# Patient Record
Sex: Female | Born: 1970 | Race: White | Hispanic: No | Marital: Married | Smoking: Never smoker
Health system: Southern US, Community
[De-identification: ages and names within clinical notes are randomized; demographics above are authoritative.]

## PROBLEM LIST (undated history)

## (undated) DIAGNOSIS — K5190 Ulcerative colitis, unspecified, without complications: Secondary | ICD-10-CM

## (undated) DIAGNOSIS — I10 Essential (primary) hypertension: Secondary | ICD-10-CM

## (undated) HISTORY — DX: Ulcerative colitis, unspecified, without complications: K51.90

---

## 2006-02-06 ENCOUNTER — Ambulatory Visit: Payer: Self-pay

## 2006-04-26 ENCOUNTER — Inpatient Hospital Stay: Payer: Self-pay

## 2007-03-27 ENCOUNTER — Ambulatory Visit: Payer: Self-pay | Admitting: Family Medicine

## 2007-04-25 ENCOUNTER — Ambulatory Visit: Payer: Self-pay | Admitting: Gastroenterology

## 2008-06-26 ENCOUNTER — Observation Stay: Payer: Self-pay | Admitting: Obstetrics and Gynecology

## 2008-07-03 ENCOUNTER — Inpatient Hospital Stay: Payer: Self-pay | Admitting: Obstetrics and Gynecology

## 2014-05-28 ENCOUNTER — Ambulatory Visit
Admit: 2014-05-28 | Disposition: A | Discharge status: Home / Self Care | Payer: Self-pay | Attending: Gastroenterology | Admitting: Gastroenterology

## 2014-05-28 LAB — HM COLONOSCOPY

## 2014-06-03 ENCOUNTER — Other Ambulatory Visit
Admit: 2014-06-03 | Disposition: A | Discharge status: Home / Self Care | Payer: Self-pay | Attending: Urgent Care | Admitting: Urgent Care

## 2014-08-25 ENCOUNTER — Telehealth: Payer: Self-pay | Admitting: Family Medicine

## 2014-08-25 DIAGNOSIS — I10 Essential (primary) hypertension: Secondary | ICD-10-CM

## 2014-08-25 MED ORDER — AMLODIPINE BESYLATE 2.5 MG PO TABS
2.5000 mg | ORAL_TABLET | Freq: Every day | ORAL | Status: DC
Start: 2014-08-25 — End: 2016-04-14

## 2014-08-25 NOTE — Telephone Encounter (Signed)
Patient advised as below. Patient verbalizes understanding and is in agreement with treatment plan.  

## 2014-08-25 NOTE — Telephone Encounter (Signed)
Pt states she started a blood pressure medication 3 weeks ago and her blood pressure is still high.  On average it is running 170/120. Pt states she does feel tired and not able to sleep at night.  Pt is asking does she need to increase he Rx or change her Rx.  [place].  [phone]

## 2014-08-25 NOTE — Telephone Encounter (Signed)
Sent additional medication, add to current treatment and follow up ov in 2 weeks.  Thanks.

## 2015-01-28 ENCOUNTER — Other Ambulatory Visit: Payer: Self-pay | Admitting: Family Medicine

## 2015-01-28 DIAGNOSIS — Z1231 Encounter for screening mammogram for malignant neoplasm of breast: Secondary | ICD-10-CM

## 2015-02-01 ENCOUNTER — Ambulatory Visit
Admission: RE | Admit: 2015-02-01 | Discharge: 2015-02-01 | Disposition: A | Discharge status: Home / Self Care | Payer: BLUE CROSS/BLUE SHIELD | Source: Ambulatory Visit | Attending: Family Medicine | Admitting: Family Medicine

## 2015-02-01 DIAGNOSIS — Z1231 Encounter for screening mammogram for malignant neoplasm of breast: Secondary | ICD-10-CM | POA: Insufficient documentation

## 2015-04-02 ENCOUNTER — Encounter: Payer: Self-pay | Admitting: Physician Assistant

## 2015-04-02 ENCOUNTER — Ambulatory Visit (INDEPENDENT_AMBULATORY_CARE_PROVIDER_SITE_OTHER): Payer: BLUE CROSS/BLUE SHIELD | Admitting: Physician Assistant

## 2015-04-02 VITALS — BP 138/96 | HR 100 | Temp 97.6°F | Resp 14 | Ht 64.0 in | Wt 244.0 lb

## 2015-04-02 DIAGNOSIS — L509 Urticaria, unspecified: Secondary | ICD-10-CM | POA: Diagnosis not present

## 2015-04-02 MED ORDER — HYDROXYZINE HCL 10 MG PO TABS: 10.0000 mg | ORAL_TABLET | Freq: Every evening | ORAL | Status: DC | PRN

## 2015-04-02 NOTE — Progress Notes (Signed)
Patient ID: Jamie Fuentes, female   DOB: [birth year], 45 y.o.   MRN: [id number]       [name] Female    DOB: [birth year]   45 y.o.   MRN: [id number] Visit Date: 04/02/2015  Today's Provider: Mar Daring, PA-C   Chief Complaint  Patient presents with  . Rash   Subjective:    HPI  Patient developed rash 2 days ago Feb 1st. Rash is located on arms, legs, stomach area and back. Some areas are worse than others patient states. Rash looks more like hives-red, itchy. She took Benadryl the first day it appeared and hives went away and she was ok until the following day she started to break out again so she took Benadryl again and it resolved the break out again. She is broken out again this morning. She does not recall been around something that could have irritated her skin like this, she has not changed lotions, bath soap or detergent. No insect bites, no tick exposure.    Allergies  Allergen Reactions  . Lac Bovis   . Sulfa Antibiotics Nausea Only, Rash and Nausea And Vomiting   Previous Medications   AMLODIPINE (NORVASC) 2.5 MG TABLET    Take 1 tablet (2.5 mg total) by mouth daily.   HYDROCHLOROTHIAZIDE (HYDRODIURIL) 12.5 MG TABLET    Take by mouth.   LISINOPRIL (PRINIVIL,ZESTRIL) 10 MG TABLET    Take by mouth.   MESALAMINE (LIALDA) 1.2 G EC TABLET    Take by mouth.   MULTIPLE VITAMIN TABLET    Take by mouth.    Review of Systems  Constitutional: Negative for fever, chills and fatigue.  HENT: Positive for facial swelling (lips a little this morning it felt like ). Negative for congestion, postnasal drip, rhinorrhea, sinus pressure, sneezing, sore throat, tinnitus, trouble swallowing and voice change.   Eyes: Negative.   Respiratory: Negative for cough, chest tightness, shortness of breath and wheezing.   Cardiovascular: Negative for chest pain and palpitations.  Gastrointestinal: Negative for nausea, vomiting, abdominal pain, diarrhea and constipation.    Musculoskeletal: Negative for myalgias, back pain, joint swelling, arthralgias, gait problem, neck pain and neck stiffness.  Skin: Positive for rash.  Allergic/Immunologic: Negative for environmental allergies, food allergies and immunocompromised state.  Neurological: Negative for dizziness, weakness, numbness and headaches.  Psychiatric/Behavioral: Negative for sleep disturbance, dysphoric mood, decreased concentration and agitation. The patient is not nervous/anxious.     Social History  Substance Use Topics  . Smoking status: Never Smoker   . Smokeless tobacco: Never Used  . Alcohol Use: Yes     Comment: rare   Objective:   BP 138/96 mmHg  Pulse 100  Temp(Src) 97.6 F (36.4 C)  Resp 14  Ht '5\' 4"'  (1.626 m)  Wt 244 lb (110.678 kg)  BMI 41.86 kg/m2  LMP 03/31/2015  Physical Exam  Constitutional: She appears well-developed and well-nourished. No distress.  Neck: Normal range of motion. Neck supple. No JVD present. No tracheal deviation present. No thyromegaly present.  Cardiovascular: Normal rate, regular rhythm and normal heart sounds.  Exam reveals no gallop and no friction rub.   No murmur heard. Pulmonary/Chest: Effort normal and breath sounds normal. No respiratory distress. She has no wheezes. She has no rales.  Lymphadenopathy:    She has no cervical adenopathy.  Skin: Skin is warm and dry. Rash noted. Rash is urticarial (noted on upper extremities bilaterally, hands, lower extremities bilaterally, abdomen and back). She is not diaphoretic.  Vitals  reviewed.       Assessment & Plan:     1. Urticaria Continue benadryl during the day as needed for urticaria. May take claritin, zyrtec, or allegra if benadryl causes sleepiness. Hydroxyzine is for nighttime use only. Make sure that no detergents, body wash, lotions, etc have been changed. Make sure there is no food correlation.  Call the office if you develop a fever, joint pains, headache, or if symptoms persist for  greater than 6 weeks.  If hives continue for greater than 6 weeks will obtain blood work including CBC, ESR, CRP, ANA and RF.  May also consider referral to allergist if wanted or warranted.  - hydrOXYzine (ATARAX/VISTARIL) 10 MG tablet; Take 1 tablet (10 mg total) by mouth at bedtime as needed.  Dispense: 30 tablet; Refill: 0       Mar Daring, PA-C  [facility]

## 2015-04-02 NOTE — Patient Instructions (Signed)
Hives Hives are itchy, red, swollen areas of the skin. They can vary in size and location on your body. Hives can come and go for hours or several days (acute hives) or for several weeks (chronic hives). Hives do not spread from person to person (noncontagious). They may get worse with scratching, exercise, and emotional stress. CAUSES   Allergic reaction to food, additives, or drugs.  Infections, including the common cold.  Illness, such as vasculitis, lupus, or thyroid disease.  Exposure to sunlight, heat, or cold.  Exercise.  Stress.  Contact with chemicals. SYMPTOMS   Red or white swollen patches on the skin. The patches may change size, shape, and location quickly and repeatedly.  Itching.  Swelling of the hands, feet, and face. This may occur if hives develop deeper in the skin. DIAGNOSIS  Your caregiver can usually tell what is wrong by performing a physical exam. Skin or blood tests may also be done to determine the cause of your hives. In some cases, the cause cannot be determined. TREATMENT  Mild cases usually get better with medicines such as antihistamines. Severe cases may require an emergency epinephrine injection. If the cause of your hives is known, treatment includes avoiding that trigger.  HOME CARE INSTRUCTIONS   Avoid causes that trigger your hives.  Take antihistamines as directed by your caregiver to reduce the severity of your hives. Non-sedating or low-sedating antihistamines are usually recommended. Do not drive while taking an antihistamine.  Take any other medicines prescribed for itching as directed by your caregiver.  Wear loose-fitting clothing.  Keep all follow-up appointments as directed by your caregiver. SEEK MEDICAL CARE IF:   You have persistent or severe itching that is not relieved with medicine.  You have painful or swollen joints. SEEK IMMEDIATE MEDICAL CARE IF:   You have a fever.  Your tongue or lips are swollen.  You have  trouble breathing or swallowing.  You feel tightness in the throat or chest.  You have abdominal pain. These problems may be the first sign of a life-threatening allergic reaction. Call your local emergency services (911 in [place] MAKE SURE YOU:   Understand these instructions.  Will watch your condition.  Will get help right away if you are not doing well or get worse.   This information is not intended to replace advice given to you by your health care provider. Make sure you discuss any questions you have with your health care provider.   Document Released: 02/13/2005 Document Revised: 02/18/2013 Document Reviewed: 05/09/2011 Elsevier Interactive Patient Education 2016 [employer]  

## 2015-09-03 ENCOUNTER — Encounter: Payer: BLUE CROSS/BLUE SHIELD | Admitting: Physician Assistant

## 2015-10-21 ENCOUNTER — Encounter: Payer: BLUE CROSS/BLUE SHIELD | Admitting: Physician Assistant

## 2015-11-11 ENCOUNTER — Ambulatory Visit (INDEPENDENT_AMBULATORY_CARE_PROVIDER_SITE_OTHER): Payer: BLUE CROSS/BLUE SHIELD | Admitting: Physician Assistant

## 2015-11-11 ENCOUNTER — Encounter: Payer: Self-pay | Admitting: Physician Assistant

## 2015-11-11 VITALS — BP 138/98 | HR 80 | Temp 98.2°F | Resp 16 | Ht 64.25 in | Wt 247.0 lb

## 2015-11-11 DIAGNOSIS — Z1239 Encounter for other screening for malignant neoplasm of breast: Secondary | ICD-10-CM | POA: Diagnosis not present

## 2015-11-11 DIAGNOSIS — Z23 Encounter for immunization: Secondary | ICD-10-CM | POA: Diagnosis not present

## 2015-11-11 DIAGNOSIS — Z0000 Encounter for general adult medical examination without abnormal findings: Secondary | ICD-10-CM | POA: Diagnosis not present

## 2015-11-11 DIAGNOSIS — H6123 Impacted cerumen, bilateral: Secondary | ICD-10-CM | POA: Diagnosis not present

## 2015-11-11 DIAGNOSIS — IMO0002 Reserved for concepts with insufficient information to code with codable children: Secondary | ICD-10-CM

## 2015-11-11 DIAGNOSIS — I10 Essential (primary) hypertension: Secondary | ICD-10-CM | POA: Diagnosis not present

## 2015-11-11 DIAGNOSIS — R7303 Prediabetes: Secondary | ICD-10-CM | POA: Diagnosis not present

## 2015-11-11 DIAGNOSIS — E559 Vitamin D deficiency, unspecified: Secondary | ICD-10-CM | POA: Insufficient documentation

## 2015-11-11 DIAGNOSIS — E668 Other obesity: Secondary | ICD-10-CM

## 2015-11-11 NOTE — Progress Notes (Signed)
[name], Female    DOB: [birth year], 45 y.o.   MRN: [id number] Visit Date: 11/11/2015  Today's Provider: Mar Daring, PA-C   Chief Complaint  Patient presents with  . Annual Exam   Subjective:    Annual physical exam Jamie Fuentes is a 45 y.o. female who presents today for health maintenance and complete physical. She feels well. She reports exercising some. She reports she is sleeping well.  Patient would like to get flu shot done today.  Last: Eye exam was around June or July 2017.  Colonoscopy 05/28/14 has colitis.  Pap smear-unk  Mammogram 01/2015-normal.  Tdap patient states about 7 years ago at [facility] find  those records.  Review of Systems  Constitutional: Negative.   Eyes: Negative.   Respiratory: Positive for shortness of breath (usually when exercising or exertion like that).   Cardiovascular: Negative.   Gastrointestinal: Negative.   Endocrine: Negative.   Genitourinary: Negative.   Musculoskeletal: Negative.   Skin: Negative.   Allergic/Immunologic: Negative.   Neurological: Positive for headaches.  Hematological: Negative.   Psychiatric/Behavioral: Negative.     Social History      She  reports that she has never smoked. She has never used smokeless tobacco. She reports that she drinks alcohol. She reports that she does not use drugs.       Social History   Social History  . Marital status: Married    Spouse name: N/A  . Number of children: N/A  . Years of education: N/A   Social History Main Topics  . Smoking status: Never Smoker  . Smokeless tobacco: Never Used  . Alcohol use Yes     Comment: rare  . Drug use: No  . Sexual activity: Not Asked   Other Topics Concern  . None   Social History Narrative  . None    History reviewed. No pertinent past medical history.   Patient Active Problem List   Diagnosis Date Noted  . Avitaminosis D 11/11/2015  . 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 Adult BMI  30+ 11/11/2015  . Hypertension 08/25/2014    Past Surgical History:  Procedure Laterality Date  . CESAREAN SECTION      Family History        Family Status  Relation Status  . Mother Alive  . Father Alive  . Sister Alive  . Brother Alive  . Brother Alive        Her family history includes Diabetes in her father; Hypertension in her brother and father; Thyroid cancer in her sister.    Allergies  Allergen Reactions  . Lac Bovis   . Sulfa Antibiotics Nausea Only, Rash and Nausea And Vomiting    Current Meds  Medication Sig  . amLODipine (NORVASC) 2.5 MG tablet Take 1 tablet (2.5 mg total) by mouth daily.  . hydrochlorothiazide (HYDRODIURIL) 12.5 MG tablet Take by mouth.  . hydrOXYzine (ATARAX/VISTARIL) 10 MG tablet Take 1 tablet (10 mg total) by mouth at bedtime as needed.  Marland Kitchen lisinopril (PRINIVIL,ZESTRIL) 10 MG tablet Take by mouth.  . mesalamine (LIALDA) 1.2 g EC tablet Take by mouth.  . Multiple Vitamin tablet Take by mouth.    Patient Care Team: Mar Daring, PA-C as PCP - General (Family Medicine)     Objective:   Vitals: BP (!) 138/98   Pulse 80   Temp 98.2 F (36.8 C)   Resp 16   Ht 5' 4.25" (1.632 m)   Wt  247 lb (112 kg)   BMI 42.07 kg/m    Physical Exam  Constitutional: She is oriented to person, place, and time. She appears well-developed and well-nourished. No distress.  HENT:  Head: Normocephalic and atraumatic.  Right Ear: Hearing, tympanic membrane, external ear and ear canal normal.  Left Ear: Hearing, tympanic membrane, external ear and ear canal normal.  Nose: Nose normal.  Mouth/Throat: Uvula is midline, oropharynx is clear and moist and mucous membranes are normal. No oropharyngeal exudate.  Cerumen impaction noted bilaterally. Air lavage was performed bilaterally. This was successful. Tympanic membranes were visualized following lavage and were normal.  Eyes: Conjunctivae and EOM are normal. Pupils are equal, round, and reactive to  light. Right eye exhibits no discharge. Left eye exhibits no discharge. No scleral icterus.  Neck: Normal range of motion. Neck supple. No JVD present. Carotid bruit is not present. No tracheal deviation present. No thyromegaly present.  Cardiovascular: Normal rate, regular rhythm, normal heart sounds and intact distal pulses.  Exam reveals no gallop and no friction rub.   No murmur heard. Pulmonary/Chest: Effort normal and breath sounds normal. No respiratory distress. She has no wheezes. She has no rales. She exhibits no tenderness. Right breast exhibits no inverted nipple, no mass, no nipple discharge, no skin change and no tenderness. Left breast exhibits no inverted nipple, no mass, no nipple discharge, no skin change and no tenderness. Breasts are symmetrical.  Abdominal: Soft. Bowel sounds are normal. She exhibits no distension and no mass. There is no tenderness. There is no rebound and no guarding.  Genitourinary: No breast swelling, tenderness, discharge or bleeding. Pelvic exam was performed with patient supine.  Genitourinary Comments: Patient refused due to menstrual cycle. She reports she will have this done next year.  Musculoskeletal: Normal range of motion. She exhibits no edema or tenderness.  Lymphadenopathy:    She has no cervical adenopathy.  Neurological: She is alert and oriented to person, place, and time. She has normal reflexes. No cranial nerve deficit. Coordination normal.  Skin: Skin is warm and dry. No rash noted. She is not diaphoretic.  Psychiatric: She has a normal mood and affect. Her behavior is normal. Judgment and thought content normal.  Vitals reviewed.    Depression Screen No flowsheet data found.    Assessment & Plan:  1. Annual physical exam Normal physical exam today. Will check labs as below and f/u pending lab results. If labs are stable and WNL she will not need to have these rechecked for one year at her next annual physical exam. She is to call  the office in the meantime if she has any acute issue, questions or concerns. - CBC with Differential - TSH  2. Breast cancer screening Breast exam today was normal. There is no family history of breast cancer. She does perform regular self breast exams. Mammogram was ordered as below. Information for [facility] clinic was given to patient so she may schedule her mammogram at her convenience. - Mammogram Digital Screening; Future  3. Cerumen impaction, bilateral Lavage successful. - Ear Lavage  4. Need for influenza vaccination Flu vaccine given today without complication. Patient sat upright for 15 minutes to check for adverse reaction before being released. - Flu Vaccine QUAD 36+ mos PF IM (Fluarix & Fluzone Quad PF)  5. Essential hypertension Stable. Continue current medical treatment plan. Will check labs as below and f/u pending results. 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  Will check labs as below and f/u pending results. - Hemoglobin A1c  7. Adult BMI 30+ Will check labs as below and f/u pending results. - Lipid panel      Mar Daring, PA-C  [facility]

## 2015-11-11 NOTE — Patient Instructions (Signed)
Health Maintenance, Female Adopting a healthy lifestyle and getting preventive care can go a long way to promote health and wellness. Talk with your health care provider about what schedule of regular examinations is right for you. This is a good chance for you to check in with your provider about disease prevention and staying healthy. In between checkups, there are plenty of things you can do on your own. Experts have done a lot of research about which lifestyle changes and preventive measures are most likely to keep you healthy. Ask your health care provider for more information. WEIGHT AND DIET  Eat a healthy diet  Be sure to include plenty of vegetables, fruits, low-fat dairy products, and lean protein.  Do not eat a lot of foods high in solid fats, added sugars, or salt.  Get regular exercise. This is one of the most important things you can do for your health.  Most adults should exercise for at least 150 minutes each week. The exercise should increase your heart rate and make you sweat (moderate-intensity exercise).  Most adults should also do strengthening exercises at least twice a week. This is in addition to the moderate-intensity exercise.  Maintain a healthy weight  Body mass index (BMI) is a measurement that can be used to identify possible weight problems. It estimates body fat based on height and weight. Your health care provider can help determine your BMI and help you achieve or maintain a healthy weight.  For females 43 years of age and older:   A BMI below 18.5 is considered underweight.  A BMI of 18.5 to 24.9 is normal.  A BMI of 25 to 29.9 is considered overweight.  A BMI of 30 and above is considered obese.  Watch levels of cholesterol and blood lipids  You should start having your blood tested for lipids and cholesterol at 43 years of age, then have this test every 5 years.  You may need to have your cholesterol levels checked more often if:  Your lipid  or cholesterol levels are high.  You are older than 43 years of age.  You are at high risk for heart disease.  CANCER SCREENING   Lung Cancer  Lung cancer screening is recommended for adults 43-66 years old who are at high risk for lung cancer because of a history of smoking.  A yearly low-dose CT scan of the lungs is recommended for people who:  Currently smoke.  Have quit within the past 15 years.  Have at least a 30-pack-year history of smoking. A pack year is smoking an average of one pack of cigarettes a day for 1 year.  Yearly screening should continue until it has been 15 years since you quit.  Yearly screening should stop if you develop a health problem that would prevent you from having lung cancer treatment.  Breast Cancer  Practice breast self-awareness. This means understanding how your breasts normally appear and feel.  It also means doing regular breast self-exams. Let your health care provider know about any changes, no matter how small.  If you are in your 20s or 30s, you should have a clinical breast exam (CBE) by a health care provider every 1-3 years as part of a regular health exam.  If you are 25 or older, have a CBE every year. Also consider having a breast X-ray (mammogram) every year.  If you have a family history of breast cancer, talk to your health care provider about genetic screening.  If you  are at high risk for breast cancer, talk to your health care provider about having an MRI and a mammogram every year.  Breast cancer gene (BRCA) assessment is recommended for women who have family members with BRCA-related cancers. BRCA-related cancers include:  Breast.  Ovarian.  Tubal.  Peritoneal cancers.  Results of the assessment will determine the need for genetic counseling and BRCA1 and BRCA2 testing. Cervical Cancer Your health care provider may recommend that you be screened regularly for cancer of the pelvic organs (ovaries, uterus, and  vagina). This screening involves a pelvic examination, including checking for microscopic changes to the surface of your cervix (Pap test). You may be encouraged to have this screening done every 3 years, beginning at age 43.  For women ages 30-65, health care providers may recommend pelvic exams and Pap testing every 3 years, or they may recommend the Pap and pelvic exam, combined with testing for human papilloma virus (HPV), every 5 years. Some types of HPV increase your risk of cervical cancer. Testing for HPV may also be done on women of any age with unclear Pap test results.  Other health care providers may not recommend any screening for nonpregnant women who are considered low risk for pelvic cancer and who do not have symptoms. Ask your health care provider if a screening pelvic exam is right for you.  If you have had past treatment for cervical cancer or a condition that could lead to cancer, you need Pap tests and screening for cancer for at least 20 years after your treatment. If Pap tests have been discontinued, your risk factors (such as having a new sexual partner) need to be reassessed to determine if screening should resume. Some women have medical problems that increase the chance of getting cervical cancer. In these cases, your health care provider may recommend more frequent screening and Pap tests. Colorectal Cancer  This type of cancer can be detected and often prevented.  Routine colorectal cancer screening usually begins at 43 years of age and continues through 43 years of age.  Your health care provider may recommend screening at an earlier age if you have risk factors for colon cancer.  Your health care provider may also recommend using home test kits to check for hidden blood in the stool.  A small camera at the end of a tube can be used to examine your colon directly (sigmoidoscopy or colonoscopy). This is done to check for the earliest forms of colorectal  cancer.  Routine screening usually begins at age 50.  Direct examination of the colon should be repeated every 5-10 years through 43 years of age. However, you may need to be screened more often if early forms of precancerous polyps or small growths are found. Skin Cancer  Check your skin from head to toe regularly.  Tell your health care provider about any new moles or changes in moles, especially if there is a change in a mole's shape or color.  Also tell your health care provider if you have a mole that is larger than the size of a pencil eraser.  Always use sunscreen. Apply sunscreen liberally and repeatedly throughout the day.  Protect yourself by wearing long sleeves, pants, a wide-brimmed hat, and sunglasses whenever you are outside. HEART DISEASE, DIABETES, AND HIGH BLOOD PRESSURE   High blood pressure causes heart disease and increases the risk of stroke. High blood pressure is more likely to develop in:  People who have blood pressure in the high end   of the normal range (130-139/85-89 mm Hg).  People who are overweight or obese.  People who are African American.  If you are 38-23 years of age, have your blood pressure checked every 3-5 years. If you are 61 years of age or older, have your blood pressure checked every year. You should have your blood pressure measured twice--once when you are at a hospital or clinic, and once when you are not at a hospital or clinic. Record the average of the two measurements. To check your blood pressure when you are not at a hospital or clinic, you can use:  An automated blood pressure machine at a pharmacy.  A home blood pressure monitor.  If you are between 45 years and 39 years old, ask your health care provider if you should take aspirin to prevent strokes.  Have regular diabetes screenings. This involves taking a blood sample to check your fasting blood sugar level.  If you are at a normal weight and have a low risk for diabetes,  have this test once every three years after 43 years of age.  If you are overweight and have a high risk for diabetes, consider being tested at a younger age or more often. PREVENTING INFECTION  Hepatitis B  If you have a higher risk for hepatitis B, you should be screened for this virus. You are considered at high risk for hepatitis B if:  You were born in a country where hepatitis B is common. Ask your health care provider which countries are considered high risk.  Your parents were born in a high-risk country, and you have not been immunized against hepatitis B (hepatitis B vaccine).  You have HIV or AIDS.  You use needles to inject street drugs.  You live with someone who has hepatitis B.  You have had sex with someone who has hepatitis B.  You get hemodialysis treatment.  You take certain medicines for conditions, including cancer, organ transplantation, and autoimmune conditions. Hepatitis C  Blood testing is recommended for:  Everyone born from [birth year] through 1963.  Anyone with known risk factors for hepatitis C. Sexually transmitted infections (STIs)  You should be screened for sexually transmitted infections (STIs) including gonorrhea and chlamydia if:  You are sexually active and are younger than 43 years of age.  You are older than 43 years of age and your health care provider tells you that you are at risk for this type of infection.  Your sexual activity has changed since you were last screened and you are at an increased risk for chlamydia or gonorrhea. Ask your health care provider if you are at risk.  If you do not have HIV, but are at risk, it may be recommended that you take a prescription medicine daily to prevent HIV infection. This is called pre-exposure prophylaxis (PrEP). You are considered at risk if:  You are sexually active and do not regularly use condoms or know the HIV status of your partner(s).  You take drugs by injection.  You are sexually  active with a partner who has HIV. Talk with your health care provider about whether you are at high risk of being infected with HIV. If you choose to begin PrEP, you should first be tested for HIV. You should then be tested every 3 months for as long as you are taking PrEP.  PREGNANCY   If you are premenopausal and you may become pregnant, ask your health care provider about preconception counseling.  If you may  become pregnant, take 400 to 800 micrograms (mcg) of folic acid every day.  If you want to prevent pregnancy, talk to your health care provider about birth control (contraception). OSTEOPOROSIS AND MENOPAUSE   Osteoporosis is a disease in which the bones lose minerals and strength with aging. This can result in serious bone fractures. Your risk for osteoporosis can be identified using a bone density scan.  If you are 61 years of age or older, or if you are at risk for osteoporosis and fractures, ask your health care provider if you should be screened.  Ask your health care provider whether you should take a calcium or vitamin D supplement to lower your risk for osteoporosis.  Menopause may have certain physical symptoms and risks.  Hormone replacement therapy may reduce some of these symptoms and risks. Talk to your health care provider about whether hormone replacement therapy is right for you.  HOME CARE INSTRUCTIONS   Schedule regular health, dental, and eye exams.  Stay current with your immunizations.   Do not use any tobacco products including cigarettes, chewing tobacco, or electronic cigarettes.  If you are pregnant, do not drink alcohol.  If you are breastfeeding, limit how much and how often you drink alcohol.  Limit alcohol intake to no more than 1 drink per day for nonpregnant women. One drink equals 12 ounces of beer, 5 ounces of wine, or 1 ounces of hard liquor.  Do not use street drugs.  Do not share needles.  Ask your health care provider for help if  you need support or information about quitting drugs.  Tell your health care provider if you often feel depressed.  Tell your health care provider if you have ever been abused or do not feel safe at home.   This information is not intended to replace advice given to you by your health care provider. Make sure you discuss any questions you have with your health care provider.   Document Released: 08/29/2010 Document Revised: 03/06/2014 Document Reviewed: 01/15/2013 Elsevier Interactive Patient Education [employer].

## 2016-02-13 LAB — HM PAP SMEAR: HM Pap smear: NEGATIVE

## 2016-04-13 ENCOUNTER — Telehealth: Payer: Self-pay

## 2016-04-13 DIAGNOSIS — H35033 Hypertensive retinopathy, bilateral: Secondary | ICD-10-CM | POA: Diagnosis not present

## 2016-04-13 NOTE — Telephone Encounter (Signed)
Contacted patient back to get an update if she went to pharmacy to get blood pressure re-checked, patient states that blood pressure machine was broke at pharmacy. KW

## 2016-04-13 NOTE — Telephone Encounter (Signed)
Patient called office requesting a refill on her blood pressure medication, patient stated that she has been of her medication for several months. Patient told CMA her blood pressure at dentist office this afternoon was 211/117, I triaged patient and she denied symptoms of shortness of breath, light headiness/dizziness, paresthesias/tingling, visual disturbance, arm or chest pain. I advised patient that she should see immediate medical attention if her blood pressure reading was that high to make sure it was an accurate reading. Patient had stated that her blood pressure has been running >190/100. I spoke with physician who agreed that patient should get blood pressure rechecked at pharmacy and if any of the above symptoms occurred that she immediatly go to the ER. Patient agreed that she will go to pharmacy to get blood pressure checked, I asked patient to give office a call back as soon as she checks it at pharmacy before 5pm. Patient has set up an appointment with Victorino DikeJennifer to follow up tomorrow afternoon at 1:30PM. Renette ButtersKW

## 2016-04-13 NOTE — Telephone Encounter (Signed)
Noted. Nicholos JohnsKathleen came and spoke with me about the patient. Advised to go to ER or Urgent care to have BP rechecked. Patient declined. Advised to recheck at fire dept or pharmacy. [facility]. Patient was given strict precautions to go to ER if she developed chest pains, SOB, weakness on one side or the other, visual changes, or dizziness.

## 2016-04-14 ENCOUNTER — Encounter: Payer: Self-pay | Admitting: Physician Assistant

## 2016-04-14 ENCOUNTER — Ambulatory Visit (INDEPENDENT_AMBULATORY_CARE_PROVIDER_SITE_OTHER): Payer: BLUE CROSS/BLUE SHIELD | Admitting: Physician Assistant

## 2016-04-14 VITALS — BP 170/120 | HR 64 | Temp 97.7°F | Resp 16 | Wt 255.6 lb

## 2016-04-14 DIAGNOSIS — I10 Essential (primary) hypertension: Secondary | ICD-10-CM

## 2016-04-14 DIAGNOSIS — G479 Sleep disorder, unspecified: Secondary | ICD-10-CM

## 2016-04-14 MED ORDER — AMLODIPINE BESYLATE 10 MG PO TABS: 10.0000 mg | ORAL_TABLET | Freq: Every day | ORAL | 0 refills | Status: DC

## 2016-04-14 MED ORDER — LISINOPRIL-HYDROCHLOROTHIAZIDE 10-12.5 MG PO TABS: 1.0000 | ORAL_TABLET | Freq: Every day | ORAL | 0 refills | Status: DC

## 2016-04-14 NOTE — Patient Instructions (Signed)
Hypertension Hypertension, commonly called high blood pressure, is when the force of blood pumping through your arteries is too strong. Your arteries are the blood vessels that carry blood from your heart throughout your body. A blood pressure reading consists of a higher number over a lower number, such as 110/72. The higher number (systolic) is the pressure inside your arteries when your heart pumps. The lower number (diastolic) is the pressure inside your arteries when your heart relaxes. Ideally you want your blood pressure below 120/80. Hypertension forces your heart to work harder to pump blood. Your arteries may become narrow or stiff. Having untreated or uncontrolled hypertension can cause heart attack, stroke, kidney disease, and other problems. What increases the risk? Some risk factors for high blood pressure are controllable. Others are not. Risk factors you cannot control include:  Race. You may be at higher risk if you are African American.  Age. Risk increases with age.  Gender. Men are at higher risk than women before age 45 years. After age 65, women are at higher risk than men. Risk factors you can control include:  Not getting enough exercise or physical activity.  Being overweight.  Getting too much fat, sugar, calories, or salt in your diet.  Drinking too much alcohol. What are the signs or symptoms? Hypertension does not usually cause signs or symptoms. Extremely high blood pressure (hypertensive crisis) may cause headache, anxiety, shortness of breath, and nosebleed. How is this diagnosed? To check if you have hypertension, your health care provider will measure your blood pressure while you are seated, with your arm held at the level of your heart. It should be measured at least twice using the same arm. Certain conditions can cause a difference in blood pressure between your right and left arms. A blood pressure reading that is higher than normal on one occasion does  not mean that you need treatment. If it is not clear whether you have high blood pressure, you may be asked to return on a different day to have your blood pressure checked again. Or, you may be asked to monitor your blood pressure at home for 1 or more weeks. How is this treated? Treating high blood pressure includes making lifestyle changes and possibly taking medicine. Living a healthy lifestyle can help lower high blood pressure. You may need to change some of your habits. Lifestyle changes may include:  Following the DASH diet. This diet is high in fruits, vegetables, and whole grains. It is low in salt, red meat, and added sugars.  Keep your sodium intake below 2,300 mg per day.  Getting at least 30-45 minutes of aerobic exercise at least 4 times per week.  Losing weight if necessary.  Not smoking.  Limiting alcoholic beverages.  Learning ways to reduce stress. Your health care provider may prescribe medicine if lifestyle changes are not enough to get your blood pressure under control, and if one of the following is true:  You are 18-59 years of age and your systolic blood pressure is above 140.  You are 60 years of age or older, and your systolic blood pressure is above 150.  Your diastolic blood pressure is above 90.  You have diabetes, and your systolic blood pressure is over 140 or your diastolic blood pressure is over 90.  You have kidney disease and your blood pressure is above 140/90.  You have heart disease and your blood pressure is above 140/90. Your personal target blood pressure may vary depending on your medical   conditions, your age, and other factors. Follow these instructions at home:  Have your blood pressure rechecked as directed by your health care provider.  Take medicines only as directed by your health care provider. Follow the directions carefully. Blood pressure medicines must be taken as prescribed. The medicine does not work as well when you skip  doses. Skipping doses also puts you at risk for problems.  Do not smoke.  Monitor your blood pressure at home as directed by your health care provider. Contact a health care provider if:  You think you are having a reaction to medicines taken.  You have recurrent headaches or feel dizzy.  You have swelling in your ankles.  You have trouble with your vision. Get help right away if:  You develop a severe headache or confusion.  You have unusual weakness, numbness, or feel faint.  You have severe chest or abdominal pain.  You vomit repeatedly.  You have trouble breathing. This information is not intended to replace advice given to you by your health care provider. Make sure you discuss any questions you have with your health care provider. Document Released: 02/13/2005 Document Revised: 07/22/2015 Document Reviewed: 12/06/2012 Elsevier Interactive Patient Education  2017 Elsevier Inc.  

## 2016-04-14 NOTE — Progress Notes (Signed)
[name] Female    DOB: 1970/05/26   46 y.o.   MRN: [id number] Visit Date: 04/14/2016  Today's Provider: Margaretann Loveless, PA-C   Chief Complaint  Patient presents with  . Hypertension   Subjective:    HPI Patient is here today with c/o elevated blood pressure. She reports that she went to her dentist yesterday and BP reading was 217/117. She reports not taking any of her medicines because they are all expired.Cardiac symptoms none. Patient denies chest pain, chest pressure/discomfort, claudication, exertional chest pressure/discomfort, fatigue, irregular heart beat, lower extremity edema, palpitations and syncope. The only symptoms that she has is sleep disturbances. She does report she has not been sleeping well for the last 2 weeks since her uncle passed away.     Allergies  Allergen Reactions  . Lac Bovis   . Sulfa Antibiotics Nausea Only, Rash and Nausea And Vomiting     Current Outpatient Prescriptions:  .  amLODipine (NORVASC) 2.5 MG tablet, Take 1 tablet (2.5 mg total) by mouth daily. (Patient not taking: Reported on 04/14/2016), Disp: 90 tablet, Rfl: 3 .  hydrochlorothiazide (HYDRODIURIL) 12.5 MG tablet, Take by mouth., Disp: , Rfl:  .  hydrOXYzine (ATARAX/VISTARIL) 10 MG tablet, Take 1 tablet (10 mg total) by mouth at bedtime as needed. (Patient not taking: Reported on 04/14/2016), Disp: 30 tablet, Rfl: 0 .  lisinopril (PRINIVIL,ZESTRIL) 10 MG tablet, Take by mouth., Disp: , Rfl:  .  mesalamine (LIALDA) 1.2 g EC tablet, Take by mouth., Disp: , Rfl:  .  Multiple Vitamin tablet, Take by mouth., Disp: , Rfl:   Review of Systems  Constitutional: Negative.   Respiratory: Negative.   Cardiovascular: Negative for chest pain, palpitations and leg swelling.  Gastrointestinal: Negative.   Neurological: Negative for dizziness, light-headedness and headaches.    Social History  Substance Use Topics  . Smoking status: Never Smoker  . Smokeless  tobacco: Never Used  . Alcohol use Yes     Comment: rare   Objective:   BP (!) 170/120 (BP Location: Right Arm, Patient Position: Sitting, Cuff Size: Large)   Pulse 64   Temp 97.7 F (36.5 C) (Oral)   Resp 16   Wt 255 lb 9.6 oz (115.9 kg)   BMI 43.53 kg/m   Physical Exam  Constitutional: She appears well-developed and well-nourished. No distress.  Neck: Normal range of motion. Neck supple. No JVD present. No tracheal deviation present. No thyromegaly present.  Cardiovascular: Normal rate, regular rhythm, normal heart sounds and intact distal pulses.  Exam reveals no gallop and no friction rub.   No murmur heard. Pulmonary/Chest: Effort normal and breath sounds normal. No respiratory distress. She has no wheezes. She has no rales.  Musculoskeletal: She exhibits no edema.  Lymphadenopathy:    She has no cervical adenopathy.  Skin: She is not diaphoretic.  Vitals reviewed.     Assessment & Plan:     1. Essential hypertension Patient has not been compliant with taking medications. Will add lisinopril-HCTZ 10-12.5mg  back. Will increase amlodipine to 10mg  since she has been tolerating well. I will see her back in 2-4 weeks to recheck BP.  - lisinopril-hydrochlorothiazide (PRINZIDE,ZESTORETIC) 10-12.5 MG tablet; Take 1 tablet by mouth daily.  Dispense: 30 tablet; Refill: 0 - amLODipine (NORVASC) 10 MG tablet; Take 1 tablet (10 mg total) by mouth daily.  Dispense: 30 tablet; Refill: 0  2. Difficulty sleeping Advised patient to try melatonin until I see her back. If  she is still not sleeping well and BP is improving we will discuss other sleep aids.        Margaretann LovelessJennifer M Danella Philson, PA-C  [facility]

## 2016-04-21 ENCOUNTER — Ambulatory Visit
Admission: RE | Admit: 2016-04-21 | Discharge: 2016-04-21 | Disposition: A | Discharge status: Home / Self Care | Payer: BLUE CROSS/BLUE SHIELD | Source: Ambulatory Visit | Attending: Physician Assistant | Admitting: Physician Assistant

## 2016-04-21 DIAGNOSIS — Z1231 Encounter for screening mammogram for malignant neoplasm of breast: Secondary | ICD-10-CM | POA: Diagnosis not present

## 2016-04-21 DIAGNOSIS — Z1239 Encounter for other screening for malignant neoplasm of breast: Secondary | ICD-10-CM

## 2016-04-24 ENCOUNTER — Telehealth: Payer: Self-pay

## 2016-04-24 NOTE — Telephone Encounter (Signed)
Pt advised. Renaldo Fiddler, CMA

## 2016-04-24 NOTE — Telephone Encounter (Signed)
-----   Message from Mar Daring, [place] sent at 04/24/2016 11:34 AM EST ----- Normal mammogram. Repeat screening in one year.

## 2016-04-27 ENCOUNTER — Ambulatory Visit: Payer: BLUE CROSS/BLUE SHIELD | Admitting: Physician Assistant

## 2016-04-27 ENCOUNTER — Encounter: Payer: Self-pay | Admitting: Physician Assistant

## 2016-04-27 ENCOUNTER — Ambulatory Visit (INDEPENDENT_AMBULATORY_CARE_PROVIDER_SITE_OTHER): Payer: BLUE CROSS/BLUE SHIELD | Admitting: Physician Assistant

## 2016-04-27 VITALS — BP 110/70 | HR 77 | Temp 98.2°F | Resp 16 | Wt 255.0 lb

## 2016-04-27 DIAGNOSIS — Z136 Encounter for screening for cardiovascular disorders: Secondary | ICD-10-CM | POA: Diagnosis not present

## 2016-04-27 DIAGNOSIS — Z13220 Encounter for screening for lipoid disorders: Secondary | ICD-10-CM | POA: Diagnosis not present

## 2016-04-27 DIAGNOSIS — R7309 Other abnormal glucose: Secondary | ICD-10-CM

## 2016-04-27 DIAGNOSIS — I10 Essential (primary) hypertension: Secondary | ICD-10-CM | POA: Diagnosis not present

## 2016-04-27 MED ORDER — LISINOPRIL-HYDROCHLOROTHIAZIDE 10-12.5 MG PO TABS: 1.0000 | ORAL_TABLET | Freq: Every day | ORAL | 1 refills | Status: DC

## 2016-04-27 MED ORDER — AMLODIPINE BESYLATE 10 MG PO TABS: 10.0000 mg | ORAL_TABLET | Freq: Every day | ORAL | 1 refills | Status: DC

## 2016-04-27 NOTE — Patient Instructions (Signed)

## 2016-04-27 NOTE — Progress Notes (Addendum)
[name] Female    DOB: 31-Jamie-1972   46 y.o.   MRN: [id number] Visit Date: 04/27/2016  Today's Provider: Mar Daring, PA-C   Chief Complaint  Patient presents with  . Hypertension  . Insomnia   Subjective:    HPI  Hypertension, follow-up:  BP Readings from Last 3 Encounters:  04/27/16 110/70  04/14/16 (!) 170/120  11/11/15 (!) 138/98    She was last seen for hypertension 2 weeks ago.  BP at that visit was 170/120. Management changes since that visit include add lisinopril-HCTZ 10-12.5 mg and increase amlodipine to 10 mg. She reports excellent compliance with treatment. She is not having side effects.  She is exercising. She is adherent to low salt diet.   Outside blood pressures are not being checked at home. Patient reports she checks it at the store and has been elevated. She is experiencing none.  Patient denies chest pain and lower extremity edema.   Cardiovascular risk factors include advanced age (older than 30 for men, 15 for women), hypertension and obesity (BMI >= 30 kg/m2).  Use of agents associated with hypertension: none.     Weight trend: stable Wt Readings from Last 3 Encounters:  04/27/16 255 lb (115.7 kg)  04/14/16 255 lb 9.6 oz (115.9 kg)  11/11/15 247 lb (112 kg)    Current diet: not asked  ------------------------------------------------------------------------   Follow up for difficulty sleeping  The patient was last seen for this 2 weeks ago. Changes made at last visit include patient advised to try OTC melatonin.  She reports not taking Melatonin.  She feels that condition is Improved. ------------------------------------------------------------------------------------ Depression screen [facility] 2/9 04/27/2016  Decreased Interest 0  Down, Depressed, Hopeless 0  PHQ - 2 Score 0  Altered sleeping 1  Tired, decreased energy 0  Change in appetite 0  Feeling bad or failure about yourself  0  Trouble  concentrating 0  Moving slowly or fidgety/restless 0  Suicidal thoughts 0  PHQ-9 Score 1       Allergies  Allergen Reactions  . Lac Bovis   . Sulfa Antibiotics Nausea Only, Rash and Nausea And Vomiting     Current Outpatient Prescriptions:  .  amLODipine (NORVASC) 10 MG tablet, Take 1 tablet (10 mg total) by mouth daily., Disp: 30 tablet, Rfl: 0 .  lisinopril-hydrochlorothiazide (PRINZIDE,ZESTORETIC) 10-12.5 MG tablet, Take 1 tablet by mouth daily., Disp: 30 tablet, Rfl: 0 .  Multiple Vitamin tablet, Take by mouth., Disp: , Rfl:   Review of Systems  Constitutional: Negative.   Respiratory: Negative.   Cardiovascular: Negative.   Musculoskeletal: Negative.   Neurological: Negative.   Psychiatric/Behavioral: Negative for sleep disturbance.    Social History  Substance Use Topics  . Smoking status: Never Smoker  . Smokeless tobacco: Never Used  . Alcohol use Yes     Comment: rare   Objective:   BP 110/70 (BP Location: Left Arm, Patient Position: Sitting, Cuff Size: Large)   Pulse 77   Temp 98.2 F (36.8 C) (Oral)   Resp 16   Wt 255 lb (115.7 kg)   LMP 04/04/2016 (Within Days)   SpO2 99%   BMI 43.43 kg/m  Vitals:   04/27/16 0817  BP: 110/70  Pulse: 77  Resp: 16  Temp: 98.2 F (36.8 C)  TempSrc: Oral  SpO2: 99%  Weight: 255 lb (115.7 kg)     Physical Exam  Constitutional: She appears well-developed and well-nourished. No distress.  Neck: Normal  range of motion. Neck supple. No JVD present. No tracheal deviation present. No thyromegaly present.  Cardiovascular: Normal rate, regular rhythm and normal heart sounds.  Exam reveals no gallop and no friction rub.   No murmur heard. Pulmonary/Chest: Effort normal and breath sounds normal. No respiratory distress. She has no wheezes. She has no rales.  Musculoskeletal: She exhibits no edema.  Lymphadenopathy:    She has no cervical adenopathy.  Skin: She is not diaphoretic.  Vitals reviewed.      Assessment  & Plan:     1. Essential hypertension Improved. Will continue current medical treatment plan. Advised to get a cuff to have at home and check BP at home two to three times weekly. If BP continues to be elevated she is to call. If not I will see her back in 6 months for CPE. - lisinopril-hydrochlorothiazide (PRINZIDE,ZESTORETIC) 10-12.5 MG tablet; Take 1 tablet by mouth daily.  Dispense: 90 tablet; Refill: 1 - amLODipine (NORVASC) 10 MG tablet; Take 1 tablet (10 mg total) by mouth daily.  Dispense: 90 tablet; Refill: 1 - CBC w/Diff/Platelet - Comprehensive Metabolic Panel (CMET) - TSH  2. Encounter for lipid screening for cardiovascular disease Will check labs as below and f/u pending results. - Lipid Profile  3. Elevated hemoglobin A1c Will check labs as below and f/u pending results. - HgB A1c       Jamie Daring, PA-C  [facility]

## 2016-04-28 ENCOUNTER — Telehealth: Payer: Self-pay

## 2016-04-28 LAB — COMPREHENSIVE METABOLIC PANEL
A/G RATIO: 1.8 (ref 1.2–2.2)
ALBUMIN: 4.3 g/dL (ref 3.5–5.5)
ALK PHOS: 63 IU/L (ref 39–117)
ALT: 27 IU/L (ref 0–32)
AST: 20 IU/L (ref 0–40)
BILIRUBIN TOTAL: 0.4 mg/dL (ref 0.0–1.2)
BUN / CREAT RATIO: 21 (ref 9–23)
BUN: 15 mg/dL (ref 6–24)
CHLORIDE: 98 mmol/L (ref 96–106)
CO2: 24 mmol/L (ref 18–29)
Calcium: 9.4 mg/dL (ref 8.7–10.2)
Creatinine, Ser: 0.73 mg/dL (ref 0.57–1.00)
GFR calc Af Amer: 117 mL/min/{1.73_m2} (ref >59)
GFR calc non Af Amer: 101 mL/min/{1.73_m2} (ref >59)
GLUCOSE: 132 mg/dL — AB (ref 65–99)
Globulin, Total: 2.4 g/dL (ref 1.5–4.5)
POTASSIUM: 4.1 mmol/L (ref 3.5–5.2)
SODIUM: 141 mmol/L (ref 134–144)
Total Protein: 6.7 g/dL (ref 6.0–8.5)

## 2016-04-28 LAB — CBC WITH DIFFERENTIAL/PLATELET
Basophils Absolute: 0 10*3/uL (ref 0.0–0.2)
Basos: 0 %
EOS (ABSOLUTE): 0.1 10*3/uL (ref 0.0–0.4)
Eos: 1 %
HEMOGLOBIN: 14.3 g/dL (ref 11.1–15.9)
Hematocrit: 42 % (ref 34.0–46.6)
Immature Grans (Abs): 0 10*3/uL (ref 0.0–0.1)
Immature Granulocytes: 0 %
LYMPHS ABS: 3 10*3/uL (ref 0.7–3.1)
Lymphs: 34 %
MCH: 30.4 pg (ref 26.6–33.0)
MCHC: 34 g/dL (ref 31.5–35.7)
MCV: 89 fL (ref 79–97)
MONOCYTES: 6 %
Monocytes Absolute: 0.5 10*3/uL (ref 0.1–0.9)
NEUTROS ABS: 5.2 10*3/uL (ref 1.4–7.0)
Neutrophils: 59 %
Platelets: 300 10*3/uL (ref 150–379)
RBC: 4.7 x10E6/uL (ref 3.77–5.28)
RDW: 13.5 % (ref 12.3–15.4)
WBC: 8.7 10*3/uL (ref 3.4–10.8)

## 2016-04-28 LAB — TSH: TSH: 1.55 u[IU]/mL (ref 0.450–4.500)

## 2016-04-28 LAB — LIPID PANEL
Chol/HDL Ratio: 3.4 ratio units (ref 0.0–4.4)
Cholesterol, Total: 160 mg/dL (ref 100–199)
HDL: 47 mg/dL (ref >39)
LDL Calculated: 92 mg/dL (ref 0–99)
Triglycerides: 106 mg/dL (ref 0–149)
VLDL Cholesterol Cal: 21 mg/dL (ref 5–40)

## 2016-04-28 LAB — HEMOGLOBIN A1C
ESTIMATED AVERAGE GLUCOSE: 117 mg/dL
HEMOGLOBIN A1C: 5.7 % — AB (ref 4.8–5.6)

## 2016-04-28 NOTE — Telephone Encounter (Signed)
No answer on home number and cell number. Not able to LM.  Thanks,  -Joseline

## 2016-04-28 NOTE — Telephone Encounter (Signed)
Pt advised.   Thanks,   -Laura  

## 2016-04-28 NOTE — Telephone Encounter (Signed)
-----   Message from Mar Daring, [place] sent at 04/28/2016  8:12 AM EST ----- All labs are within normal limits and stable with exception of A1c that is only borderline high at 5.7. Continue working on lifestyle modifications with healthy dieting and exercise. Thanks! -JB

## 2016-10-01 ENCOUNTER — Other Ambulatory Visit: Payer: Self-pay | Admitting: Physician Assistant

## 2016-10-01 DIAGNOSIS — I10 Essential (primary) hypertension: Secondary | ICD-10-CM

## 2016-10-16 ENCOUNTER — Ambulatory Visit (INDEPENDENT_AMBULATORY_CARE_PROVIDER_SITE_OTHER): Payer: BLUE CROSS/BLUE SHIELD | Admitting: Physician Assistant

## 2016-10-16 ENCOUNTER — Encounter: Payer: Self-pay | Admitting: Physician Assistant

## 2016-10-16 ENCOUNTER — Telehealth: Payer: Self-pay

## 2016-10-16 VITALS — BP 140/82 | HR 89 | Temp 98.8°F | Resp 16 | Wt 258.0 lb

## 2016-10-16 DIAGNOSIS — Z6841 Body Mass Index (BMI) 40.0 and over, adult: Secondary | ICD-10-CM | POA: Diagnosis not present

## 2016-10-16 DIAGNOSIS — I10 Essential (primary) hypertension: Secondary | ICD-10-CM | POA: Diagnosis not present

## 2016-10-16 DIAGNOSIS — G5603 Carpal tunnel syndrome, bilateral upper limbs: Secondary | ICD-10-CM | POA: Diagnosis not present

## 2016-10-16 NOTE — Telephone Encounter (Signed)
Patient called saying that she is having numbness in her hands only in the morning. She reports that her symptoms get better once the day goes on. She has had this over 1 month. She denies any other symptoms such as chest pain, shortness of breath, nausea, or a tingling sensation. Patient was scheduled an appt later today for evaluation.

## 2016-10-16 NOTE — Progress Notes (Signed)
[name] Female    DOB: [birth year]   46 y.o.   MRN: [id number] Visit Date: 10/16/2016  Today's Provider: Mar Daring, PA-C   Chief Complaint  Patient presents with  . Hand Numbness   Subjective:    HPI Patient is here with c/o hand numbness, bilateral. She reports that when she wakes up in the morning her hands are numb with some SOB. She reports that with any activity she feels out of breath. She feels the SOB is due to excess weight and deconditioning. She reports that this was discussed in her physical and was advised to exercise and keep a healthy diet.  She has not been exercising much and is expecting to increase her exercise now since her husband is coaching soccer, she plans to walk the field at practice. She reports that she did have some left elbow pain before the numbness started. This is better. She has been using Tumeric for inflammation.      Allergies  Allergen Reactions  . Lac Bovis   . Sulfa Antibiotics Nausea Only, Rash and Nausea And Vomiting     Current Outpatient Prescriptions:  .  amLODipine (NORVASC) 10 MG tablet, TAKE 1 TABLET DAILY, Disp: 90 tablet, Rfl: 1 .  lisinopril-hydrochlorothiazide (PRINZIDE,ZESTORETIC) 10-12.5 MG tablet, TAKE 1 TABLET DAILY, Disp: 90 tablet, Rfl: 1 .  Multiple Vitamin tablet, Take by mouth., Disp: , Rfl:   Review of Systems  Constitutional: Negative for fatigue.  Eyes: Negative for pain.  Respiratory: Positive for shortness of breath (with exercise). Negative for cough, chest tightness and wheezing.   Cardiovascular: Negative for chest pain, palpitations and leg swelling.  Gastrointestinal: Negative for abdominal pain.  Neurological: Positive for numbness. Negative for dizziness, weakness, light-headedness and headaches.    Social History  Substance Use Topics  . Smoking status: Never Smoker  . Smokeless tobacco: Never Used  . Alcohol use Yes     Comment: rare   Objective:   BP 140/82 (BP  Location: Right Arm, Patient Position: Sitting, Cuff Size: Normal)   Pulse 89   Temp 98.8 F (37.1 C) (Oral)   Resp 16   Wt 258 lb (117 kg)   LMP 09/09/2016   BMI 43.94 kg/m     Physical Exam  Constitutional: She appears well-developed and well-nourished. No distress.  Neck: Normal range of motion. Neck supple. No JVD present. No tracheal deviation present. No thyromegaly present.  Cardiovascular: Normal rate, regular rhythm and normal heart sounds.  Exam reveals no gallop and no friction rub.   No murmur heard. Pulmonary/Chest: Effort normal and breath sounds normal. No respiratory distress. She has no wheezes. She has no rales.  Musculoskeletal: She exhibits no edema.       Right wrist: Normal.       Left wrist: Normal.  Positive phalen test in right, negative in left; negative tinel sign bilaterally  Lymphadenopathy:    She has no cervical adenopathy.  Skin: She is not diaphoretic.  Vitals reviewed.      Assessment & Plan:     1. Bilateral carpal tunnel syndrome Worsening. Suspect carpal tunnel. She is choosing to use conservative therapy. She will start night splints and use anti-inflammatories prn.   2. Essential hypertension Elevated today. Continue amlodipine 62m and lisinopril-hctz 1-12.550m Patient reports home readings have been normal.   3. Class 3 severe obesity due to excess calories with serious comorbidity and body mass index (BMI) of 40.0 to 44.9 in  adult [facility]) Counseled patient on healthy lifestyle modifications including dieting and exercise.        Mar Daring, PA-C  [facility]

## 2016-10-16 NOTE — Patient Instructions (Signed)
Carpal Tunnel Syndrome Carpal tunnel syndrome is a condition that causes pain in your hand and arm. The carpal tunnel is a narrow area located on the palm side of your wrist. Repeated wrist motion or certain diseases may cause swelling within the tunnel. This swelling pinches the main nerve in the wrist (median nerve). What are the causes? This condition may be caused by:  Repeated wrist motions.  Wrist injuries.  Arthritis.  A cyst or tumor in the carpal tunnel.  Fluid buildup during pregnancy.  Sometimes the cause of this condition is not known. What increases the risk? This condition is more likely to develop in:  People who have jobs that cause them to repeatedly move their wrists in the same motion, such as Art gallery manager.  Women.  People with certain conditions, such as: ? Diabetes. ? Obesity. ? An underactive thyroid (hypothyroidism). ? Kidney failure.  What are the signs or symptoms? Symptoms of this condition include:  A tingling feeling in your fingers, especially in your thumb, index, and middle fingers.  Tingling or numbness in your hand.  An aching feeling in your entire arm, especially when your wrist and elbow are bent for long periods of time.  Wrist pain that goes up your arm to your shoulder.  Pain that goes down into your palm or fingers.  A weak feeling in your hands. You may have trouble grabbing and holding items.  Your symptoms may feel worse during the night. How is this diagnosed? This condition is diagnosed with a medical history and physical exam. You may also have tests, including:  An electromyogram (EMG). This test measures electrical signals sent by your nerves into the muscles.  X-rays.  How is this treated? Treatment for this condition includes:  Lifestyle changes. It is important to stop doing or modify the activity that caused your condition.  Physical or occupational therapy.  Medicines for pain and inflammation.  This may include medicine that is injected into your wrist.  A wrist splint.  Surgery.  Follow these instructions at home: If you have a splint:  Wear it as told by your health care provider. Remove it only as told by your health care provider.  Loosen the splint if your fingers become numb and tingle, or if they turn cold and blue.  Keep the splint clean and dry. General instructions  Take over-the-counter and prescription medicines only as told by your health care provider.  Rest your wrist from any activity that may be causing your pain. If your condition is work related, talk to your employer about changes that can be made, such as getting a wrist pad to use while typing.  If directed, apply ice to the painful area: ? Put ice in a plastic bag. ? Place a towel between your skin and the bag. ? Leave the ice on for 20 minutes, 2-3 times per day.  Keep all follow-up visits as told by your health care provider. This is important.  Do any exercises as told by your health care provider, physical therapist, or occupational therapist. Contact a health care provider if:  You have new symptoms.  Your pain is not controlled with medicines.  Your symptoms get worse. This information is not intended to replace advice given to you by your health care provider. Make sure you discuss any questions you have with your health care provider. Document Released: 02/11/2000 Document Revised: 06/24/2015 Document Reviewed: 07/01/2014 Elsevier Interactive Patient Education  2017 [employer].

## 2016-11-24 ENCOUNTER — Encounter: Payer: Self-pay | Admitting: Physician Assistant

## 2016-12-15 ENCOUNTER — Encounter: Payer: Self-pay | Admitting: Physician Assistant

## 2016-12-20 ENCOUNTER — Encounter: Payer: Self-pay | Admitting: Physician Assistant

## 2016-12-21 ENCOUNTER — Ambulatory Visit (INDEPENDENT_AMBULATORY_CARE_PROVIDER_SITE_OTHER): Payer: BLUE CROSS/BLUE SHIELD | Admitting: Physician Assistant

## 2016-12-21 ENCOUNTER — Encounter: Payer: Self-pay | Admitting: Physician Assistant

## 2016-12-21 VITALS — BP 126/70 | HR 77 | Temp 98.4°F | Resp 16 | Ht 64.0 in | Wt 253.8 lb

## 2016-12-21 DIAGNOSIS — N760 Acute vaginitis: Secondary | ICD-10-CM | POA: Diagnosis not present

## 2016-12-21 DIAGNOSIS — Z0000 Encounter for general adult medical examination without abnormal findings: Secondary | ICD-10-CM | POA: Diagnosis not present

## 2016-12-21 DIAGNOSIS — I10 Essential (primary) hypertension: Secondary | ICD-10-CM | POA: Diagnosis not present

## 2016-12-21 DIAGNOSIS — Z124 Encounter for screening for malignant neoplasm of cervix: Secondary | ICD-10-CM

## 2016-12-21 DIAGNOSIS — Z23 Encounter for immunization: Secondary | ICD-10-CM | POA: Diagnosis not present

## 2016-12-21 DIAGNOSIS — Z1231 Encounter for screening mammogram for malignant neoplasm of breast: Secondary | ICD-10-CM

## 2016-12-21 DIAGNOSIS — Z1239 Encounter for other screening for malignant neoplasm of breast: Secondary | ICD-10-CM

## 2016-12-21 DIAGNOSIS — R7303 Prediabetes: Secondary | ICD-10-CM

## 2016-12-21 DIAGNOSIS — B9689 Other specified bacterial agents as the cause of diseases classified elsewhere: Secondary | ICD-10-CM

## 2016-12-21 DIAGNOSIS — E559 Vitamin D deficiency, unspecified: Secondary | ICD-10-CM | POA: Diagnosis not present

## 2016-12-21 DIAGNOSIS — Z6841 Body Mass Index (BMI) 40.0 and over, adult: Secondary | ICD-10-CM | POA: Diagnosis not present

## 2016-12-21 LAB — POCT URINALYSIS DIPSTICK
Bilirubin, UA: NEGATIVE
Blood, UA: NEGATIVE
Glucose, UA: NEGATIVE
Ketones, UA: NEGATIVE
Leukocytes, UA: NEGATIVE
Nitrite, UA: NEGATIVE
PH UA: 7 (ref 5.0–8.0)
PROTEIN UA: NEGATIVE
Spec Grav, UA: 1.01 (ref 1.010–1.025)
UROBILINOGEN UA: 0.2 U/dL

## 2016-12-21 MED ORDER — METRONIDAZOLE 500 MG PO TABS: 500.0000 mg | ORAL_TABLET | Freq: Two times a day (BID) | ORAL | 0 refills | Status: DC

## 2016-12-21 MED ORDER — LISINOPRIL-HYDROCHLOROTHIAZIDE 10-12.5 MG PO TABS
1.0000 | ORAL_TABLET | Freq: Every day | ORAL | 3 refills | Status: DC
Start: 2016-12-21 — End: 2017-12-22

## 2016-12-21 MED ORDER — AMLODIPINE BESYLATE 10 MG PO TABS: 10.0000 mg | ORAL_TABLET | Freq: Every day | ORAL | 3 refills | Status: DC

## 2016-12-21 MED ORDER — LISINOPRIL-HYDROCHLOROTHIAZIDE 10-12.5 MG PO TABS: 1.0000 | ORAL_TABLET | Freq: Every day | ORAL | 3 refills | Status: DC

## 2016-12-21 NOTE — Progress Notes (Signed)
[name], Female    DOB: 25-Jul-1970, 46 y.o.   MRN: [id number] Visit Date: 12/21/2016  Today's Provider: Mar Daring, PA-C   Chief Complaint  Patient presents with  . Annual Exam   Subjective:    Annual physical exam Jamie Fuentes is a 46 y.o. female who presents today for health maintenance and complete physical. She feels well. She reports exercising 2 days. She reports she is sleeping well.  Patient C/O of "bad smell" with urination. Patient denies any painful/burning urination, or fever.  11/11/15 CPE 04/21/16 Mammogram-BI-RADS 1 1217/12 Pap-negative; HPV-negative 05/28/14 Colonoscopy-polyps, pathology-neg -----------------------------------------------------------------  Hypertension, follow-up:  BP Readings from Last 3 Encounters:  12/21/16 126/70  10/16/16 140/82  04/27/16 110/70    She was last seen for hypertension 7 months ago.  BP at that visit was 110/70. Management changes since that visit include check labs. She reports excellent compliance with treatment. She is not having side effects.  She is exercising. She is adherent to low salt diet.   Outside blood pressures are stable. She is experiencing none.  Patient denies chest pain and lower extremity edema.   Cardiovascular risk factors include obesity (BMI >= 30 kg/m2).  Use of agents associated with hypertension: none.     Weight trend: stable Wt Readings from Last 3 Encounters:  12/21/16 253 lb 12.8 oz (115.1 kg)  10/16/16 258 lb (117 kg)  04/27/16 255 lb (115.7 kg)    Current diet: in general, a "healthy" diet    ------------------------------------------------------------------------    Review of Systems  Constitutional: Negative.   HENT: Negative.   Eyes: Negative.   Respiratory: Positive for shortness of breath.   Cardiovascular: Negative.   Gastrointestinal: Negative.   Endocrine: Negative.   Genitourinary: Negative.   Musculoskeletal:  Negative.   Skin: Negative.   Allergic/Immunologic: Negative.   Neurological: Positive for numbness.  Hematological: Negative.   Psychiatric/Behavioral: Negative.     Social History      She  reports that she has never smoked. She has never used smokeless tobacco. She reports that she drinks alcohol. She reports that she does not use drugs.       Social History   Social History  . Marital status: Married    Spouse name: N/A  . Number of children: N/A  . Years of education: N/A   Social History Main Topics  . Smoking status: Never Smoker  . Smokeless tobacco: Never Used  . Alcohol use Yes     Comment: rare  . Drug use: No  . Sexual activity: Not Asked   Other Topics Concern  . None   Social History Narrative  . None    No past medical history on file.   Patient Active Problem List   Diagnosis Date Noted  . Class 3 severe obesity due to excess calories with serious comorbidity and body mass index (BMI) of 40.0 to 44.9 in adult ([place]) 10/16/2016  . Avitaminosis D 11/11/2015  . 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 Hypertension 08/25/2014    Past Surgical History:  Procedure Laterality Date  . CESAREAN SECTION      Family History        Family Status  Relation Status  . Mother Alive  . Father Alive  . Sister Alive  . Brother Alive  . Brother Alive  . Neg Hx (Not Specified)        Her family history includes Diabetes in her father; Hypertension in her brother and  father; Thyroid cancer in her sister.     Allergies  Allergen Reactions  . Lac Bovis   . Sulfa Antibiotics Nausea Only, Rash and Nausea And Vomiting     Current Outpatient Prescriptions:  .  amLODipine (NORVASC) 10 MG tablet, TAKE 1 TABLET DAILY, Disp: 90 tablet, Rfl: 1 .  lisinopril-hydrochlorothiazide (PRINZIDE,ZESTORETIC) 10-12.5 MG tablet, TAKE 1 TABLET DAILY, Disp: 90 tablet, Rfl: 1 .  Multiple Vitamin tablet, Take by mouth., Disp: , Rfl:    Patient Care Team: Mar Daring, PA-C as  PCP - General (Family Medicine)      Objective:   Vitals: BP 126/70 (BP Location: Left Arm, Patient Position: Sitting, Cuff Size: Large)   Pulse 77   Temp 98.4 F (36.9 C) (Oral)   Resp 16   Ht 5' 4"  (1.626 m)   Wt 253 lb 12.8 oz (115.1 kg)   SpO2 99%   BMI 43.56 kg/m    Vitals:   12/21/16 0915  BP: 126/70  Pulse: 77  Resp: 16  Temp: 98.4 F (36.9 C)  TempSrc: Oral  SpO2: 99%  Weight: 253 lb 12.8 oz (115.1 kg)  Height: 5' 4"  (1.626 m)     Physical Exam  Constitutional: She is oriented to person, place, and time. She appears well-developed and well-nourished. No distress.  HENT:  Head: Normocephalic and atraumatic.  Right Ear: Hearing, tympanic membrane, external ear and ear canal normal.  Left Ear: Hearing, tympanic membrane, external ear and ear canal normal.  Nose: Nose normal.  Mouth/Throat: Uvula is midline, oropharynx is clear and moist and mucous membranes are normal. No oropharyngeal exudate.  Eyes: Pupils are equal, round, and reactive to light. Conjunctivae and EOM are normal. Right eye exhibits no discharge. Left eye exhibits no discharge. No scleral icterus.  Neck: Normal range of motion. Neck supple. No JVD present. Carotid bruit is not present. No tracheal deviation present. No thyromegaly present.  Cardiovascular: Normal rate, regular rhythm, normal heart sounds and intact distal pulses.  Exam reveals no gallop and no friction rub.   No murmur heard. Pulmonary/Chest: Effort normal and breath sounds normal. No respiratory distress. She has no wheezes. She has no rales. She exhibits no tenderness. Right breast exhibits no inverted nipple, no mass, no nipple discharge, no skin change and no tenderness. Left breast exhibits no inverted nipple, no mass, no nipple discharge, no skin change and no tenderness. Breasts are symmetrical.  Abdominal: Soft. Bowel sounds are normal. She exhibits no distension and no mass. There is no tenderness. There is no rebound and no  guarding. Hernia confirmed negative in the right inguinal area and confirmed negative in the left inguinal area.  Genitourinary: Rectum normal and uterus normal. No breast swelling, tenderness, discharge or bleeding. Pelvic exam was performed with patient supine. There is no rash, tenderness, lesion or injury on the right labia. There is no rash, tenderness, lesion or injury on the left labia. Cervix exhibits no motion tenderness, no discharge and no friability. Right adnexum displays no mass, no tenderness and no fullness. Left adnexum displays no mass, no tenderness and no fullness. No erythema, tenderness or bleeding in the vagina. No signs of injury around the vagina. Vaginal discharge (copious milky white, foul smelling odor) found.  Musculoskeletal: Normal range of motion. She exhibits no edema or tenderness.  Lymphadenopathy:    She has no cervical adenopathy.       Right: No inguinal adenopathy present.       Left: No inguinal adenopathy  present.  Neurological: She is alert and oriented to person, place, and time. She has normal reflexes. No cranial nerve deficit. Coordination normal.  Skin: Skin is warm and dry. No rash noted. She is not diaphoretic.  Psychiatric: She has a normal mood and affect. Her behavior is normal. Judgment and thought content normal.  Vitals reviewed.    Depression Screen PHQ 2/9 Scores 12/21/2016 04/27/2016  PHQ - 2 Score 0 0  PHQ- 9 Score 1 1      Assessment & Plan:     Routine Health Maintenance and Physical Exam  Exercise Activities and Dietary recommendations Goals    None      Immunization History  Administered Date(s) Administered  . Influenza Split 02/10/2010, 02/13/2011, 04/05/2012  . Influenza,inj,Quad PF,6+ Mos 11/11/2015    Health Maintenance  Topic Date Due  . HIV Screening  05/12/1987  . TETANUS/TDAP  05/12/1991  . INFLUENZA VACCINE  09/27/2016  . PAP SMEAR  02/13/2019     Discussed health benefits of physical activity, and  encouraged her to engage in regular exercise appropriate for her age and condition.    1. Annual physical exam Normal physical exam today. Will check labs as below and f/u pending lab results. If labs are stable and WNL she will not need to have these rechecked for one year at her next annual physical exam. She is to call the office in the meantime if she has any acute issue, questions or concerns. - POCT urinalysis dipstick - CBC with Differential/Platelet - Comprehensive metabolic panel - Lipid panel  2. Cervical cancer screening Pap collected today. Will send as below and f/u pending results. - Pap IG and HPV (high risk) DNA detection  3. Breast cancer screening Mammogram due in February. Mammogram ordered as below. She will call to schedule. Patient deferred breast exam.  - MM Digital Screening; Future   4. Essential hypertension Stable. Diagnosis pulled for medication refill. Continue current medical treatment plan. Will check labs as below and f/u pending results. - Comprehensive metabolic panel - Lipid panel - amLODipine (NORVASC) 10 MG tablet; Take 1 tablet (10 mg total) by mouth daily.  Dispense: 90 tablet; Refill: 3 - lisinopril-hydrochlorothiazide (PRINZIDE,ZESTORETIC) 10-12.5 MG tablet; Take 1 tablet by mouth daily.  Dispense: 90 tablet; Refill: 3  5. Class 3 severe obesity due to excess calories with serious comorbidity and body mass index (BMI) of 40.0 to 44.9 in adult [facility]) Will check labs as below and f/u pending results. 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 - Comprehensive metabolic panel - Lipid panel - HgB A1c  7. Avitaminosis D Will check labs as below and f/u pending results. - Vitamin D (25 hydroxy)  8. BV (bacterial vaginosis) Suspect BV due to findings on exam. Will give Metronidazole as below.  - metroNIDAZOLE (FLAGYL) 500 MG tablet; Take 1 tablet (500 mg total) by mouth 2 (two) times daily.  Dispense:  14 tablet; Refill: 0  9. Need for influenza vaccination Flu vaccine given today without complication. Patient sat upright for 15 minutes to check for adverse reaction before being released. - Flu Vaccine QUAD 36+ mos IM --------------------------------------------------------------------    Mar Daring, PA-C  [facility]

## 2016-12-21 NOTE — Patient Instructions (Signed)

## 2016-12-22 ENCOUNTER — Telehealth: Payer: Self-pay

## 2016-12-22 DIAGNOSIS — E559 Vitamin D deficiency, unspecified: Secondary | ICD-10-CM

## 2016-12-22 LAB — LIPID PANEL
CHOLESTEROL: 155 mg/dL (ref <200)
HDL: 45 mg/dL — ABNORMAL LOW (ref >50)
LDL CHOLESTEROL (CALC): 90 mg/dL
Non-HDL Cholesterol (Calc): 110 mg/dL (calc) (ref <130)
TRIGLYCERIDES: 105 mg/dL (ref <150)
Total CHOL/HDL Ratio: 3.4 (calc) (ref <5.0)

## 2016-12-22 LAB — HEMOGLOBIN A1C
HEMOGLOBIN A1C: 5.8 %{Hb} — AB (ref <5.7)
MEAN PLASMA GLUCOSE: 120 (calc)
eAG (mmol/L): 6.6 (calc)

## 2016-12-22 LAB — CBC WITH DIFFERENTIAL/PLATELET
BASOS PCT: 0.5 %
Basophils Absolute: 43 cells/uL (ref 0–200)
Eosinophils Absolute: 162 cells/uL (ref 15–500)
Eosinophils Relative: 1.9 %
HCT: 35.7 % (ref 35.0–45.0)
Hemoglobin: 12 g/dL (ref 11.7–15.5)
Lymphs Abs: 2882 cells/uL (ref 850–3900)
MCH: 29.1 pg (ref 27.0–33.0)
MCHC: 33.6 g/dL (ref 32.0–36.0)
MCV: 86.7 fL (ref 80.0–100.0)
MONOS PCT: 5.2 %
MPV: 10.9 fL (ref 7.5–12.5)
NEUTROS PCT: 58.5 %
Neutro Abs: 4973 cells/uL (ref 1500–7800)
PLATELETS: 295 10*3/uL (ref 140–400)
RBC: 4.12 10*6/uL (ref 3.80–5.10)
RDW: 12.8 % (ref 11.0–15.0)
TOTAL LYMPHOCYTE: 33.9 %
WBC mixed population: 442 cells/uL (ref 200–950)
WBC: 8.5 10*3/uL (ref 3.8–10.8)

## 2016-12-22 LAB — VITAMIN D 25 HYDROXY (VIT D DEFICIENCY, FRACTURES): VIT D 25 HYDROXY: 25 ng/mL — AB (ref 30–100)

## 2016-12-22 MED ORDER — VITAMIN D (ERGOCALCIFEROL) 1.25 MG (50000 UNIT) PO CAPS: 50000.0000 [IU] | ORAL_CAPSULE | ORAL | 1 refills | Status: DC

## 2016-12-22 NOTE — Telephone Encounter (Signed)
Sent!

## 2016-12-22 NOTE — Telephone Encounter (Signed)
-----   Message from Mar Daring, PA-C sent at 12/22/2016  1:38 PM EDT ----- Cholesterol stable and WNL. Blood count normal. A1c up just slightly at 5.8 from 5.7 last year. Vit D low at 25. Recommend taking an OTC Vit D supplement of 800-2000IU daily.

## 2016-12-22 NOTE — Telephone Encounter (Signed)
Patient advised as below. Patient is requesting prescription Vitamin D.  Patient reports she has taken this in the past.

## 2016-12-22 NOTE — Telephone Encounter (Signed)
NA. Voicemail not set up.  

## 2016-12-27 ENCOUNTER — Telehealth: Payer: Self-pay

## 2016-12-27 DIAGNOSIS — R87810 Cervical high risk human papillomavirus (HPV) DNA test positive: Secondary | ICD-10-CM

## 2016-12-27 LAB — PAP IG AND HPV HIGH-RISK: HPV DNA High Risk: DETECTED — AB

## 2016-12-27 NOTE — Telephone Encounter (Signed)
Pt would like to proceed with referral.

## 2016-12-27 NOTE — Telephone Encounter (Signed)
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 Ok to repeat in one year, but if patient desires I can refer to GYN for further evaluation as well. Pap also positive for BV which I suspected and have already treated you for.

## 2016-12-27 NOTE — Telephone Encounter (Signed)
Referral placed.

## 2017-01-08 ENCOUNTER — Telehealth: Payer: Self-pay | Admitting: Physician Assistant

## 2017-01-08 DIAGNOSIS — T3695XA Adverse effect of unspecified systemic antibiotic, initial encounter: Principal | ICD-10-CM

## 2017-01-08 DIAGNOSIS — B379 Candidiasis, unspecified: Secondary | ICD-10-CM

## 2017-01-08 MED ORDER — FLUCONAZOLE 150 MG PO TABS: 150.0000 mg | ORAL_TABLET | Freq: Once | ORAL | 0 refills | Status: AC

## 2017-01-08 NOTE — Telephone Encounter (Signed)
Is odor the same? Sounds like may have yeast infection now. Will send in diflucan.

## 2017-01-08 NOTE — Telephone Encounter (Signed)
Patient reports that the odor is the same. However, she will try the diflucan and if it does not help, she will call back.

## 2017-01-08 NOTE — Telephone Encounter (Signed)
Pt states she was in several weeks and she was having vaginal odor.  Pt has finished all the medication given.  Pt is now having a white discharge, still having an odor and some burning/itching.  [place].  [phone]

## 2017-01-08 NOTE — Telephone Encounter (Signed)
Please Review.  Thanks,  -Corrine Tillis 

## 2017-01-11 ENCOUNTER — Telehealth: Payer: Self-pay | Admitting: Physician Assistant

## 2017-01-11 DIAGNOSIS — T3695XA Adverse effect of unspecified systemic antibiotic, initial encounter: Principal | ICD-10-CM

## 2017-01-11 DIAGNOSIS — B379 Candidiasis, unspecified: Secondary | ICD-10-CM

## 2017-01-11 MED ORDER — FLUCONAZOLE 150 MG PO TABS: 150.0000 mg | ORAL_TABLET | Freq: Once | ORAL | 0 refills | Status: AC

## 2017-01-11 NOTE — Telephone Encounter (Signed)
Pt states she rec'd on Monday to help with a yeast infection.  Pt states is is better but not completely gone.  Pt is still having a discharge.  Pt is asking if she will need an additional Rx.  [place].  [phone]

## 2017-01-11 NOTE — Telephone Encounter (Signed)
Please review-Dexter Sauser V Nessa Ramaker, RMA  

## 2017-01-11 NOTE — Telephone Encounter (Signed)
Diflucan sent

## 2017-05-01 ENCOUNTER — Ambulatory Visit (INDEPENDENT_AMBULATORY_CARE_PROVIDER_SITE_OTHER): Payer: BLUE CROSS/BLUE SHIELD | Admitting: Physician Assistant

## 2017-05-01 NOTE — Progress Notes (Signed)
Opened in error

## 2017-05-08 ENCOUNTER — Emergency Department: Payer: Worker's Compensation

## 2017-05-08 ENCOUNTER — Encounter: Payer: Self-pay | Admitting: Medical Oncology

## 2017-05-08 ENCOUNTER — Emergency Department
Admission: EM | Admit: 2017-05-08 | Discharge: 2017-05-08 | Disposition: A | Discharge status: Home / Self Care | Payer: Worker's Compensation | Attending: Emergency Medicine | Admitting: Emergency Medicine

## 2017-05-08 DIAGNOSIS — M7121 Synovial cyst of popliteal space [Baker], right knee: Secondary | ICD-10-CM | POA: Diagnosis not present

## 2017-05-08 DIAGNOSIS — L03115 Cellulitis of right lower limb: Secondary | ICD-10-CM | POA: Insufficient documentation

## 2017-05-08 DIAGNOSIS — I10 Essential (primary) hypertension: Secondary | ICD-10-CM | POA: Insufficient documentation

## 2017-05-08 DIAGNOSIS — E099 Drug or chemical induced diabetes mellitus without complications: Secondary | ICD-10-CM | POA: Insufficient documentation

## 2017-05-08 DIAGNOSIS — I8001 Phlebitis and thrombophlebitis of superficial vessels of right lower extremity: Secondary | ICD-10-CM | POA: Diagnosis not present

## 2017-05-08 DIAGNOSIS — M79661 Pain in right lower leg: Secondary | ICD-10-CM | POA: Diagnosis not present

## 2017-05-08 DIAGNOSIS — R2241 Localized swelling, mass and lump, right lower limb: Secondary | ICD-10-CM | POA: Diagnosis present

## 2017-05-08 HISTORY — DX: Essential (primary) hypertension: I10

## 2017-05-08 MED ORDER — NAPROXEN 500 MG PO TABS: 500.0000 mg | ORAL_TABLET | Freq: Two times a day (BID) | ORAL | 0 refills | Status: DC

## 2017-05-08 MED ORDER — KETOROLAC TROMETHAMINE 60 MG/2ML IM SOLN
15.0000 mg | Freq: Once | INTRAMUSCULAR | Status: AC
  Administered 2017-05-08: 15 mg via INTRAMUSCULAR
  Filled 2017-05-08: qty 2

## 2017-05-08 MED ORDER — CEPHALEXIN 500 MG PO CAPS: 500.0000 mg | ORAL_CAPSULE | Freq: Three times a day (TID) | ORAL | 0 refills | Status: DC

## 2017-05-08 NOTE — ED Notes (Signed)
Patient transported to Ultrasound 

## 2017-05-08 NOTE — ED Triage Notes (Signed)
Pt reports that she began to notice swelling to RLE with redness and warmth to area. Denies hx of blood clots. No use of birth control/non smoker. Denies SOB.

## 2017-05-08 NOTE — Discharge Instructions (Signed)
Results for orders placed or performed in visit on 12/21/16  CBC with Differential/Platelet  Result Value Ref Range   WBC 8.5 3.8 - 10.8 Thousand/uL   RBC 4.12 3.80 - 5.10 Million/uL   Hemoglobin 12.0 11.7 - 15.5 g/dL   HCT 16.1 09.6 - 04.5 %   MCV 86.7 80.0 - 100.0 fL   MCH 29.1 27.0 - 33.0 pg   MCHC 33.6 32.0 - 36.0 g/dL   RDW 40.9 81.1 - 91.4 %   Platelets 295 140 - 400 Thousand/uL   MPV 10.9 7.5 - 12.5 fL   Neutro Abs 4,973 1,500 - 7,800 cells/uL   Lymphs Abs 2,882 850 - 3,900 cells/uL   WBC mixed population 442 200 - 950 cells/uL   Eosinophils Absolute 162 15 - 500 cells/uL   Basophils Absolute 43 0 - 200 cells/uL   Neutrophils Relative % 58.5 %   Total Lymphocyte 33.9 %   Monocytes Relative 5.2 %   Eosinophils Relative 1.9 %   Basophils Relative 0.5 %  Lipid panel  Result Value Ref Range   Cholesterol 155 <200 mg/dL   HDL 45 (L) >78 mg/dL   Triglycerides 295 <621 mg/dL   LDL Cholesterol (Calc) 90 mg/dL (calc)   Total CHOL/HDL Ratio 3.4 <5.0 (calc)   Non-HDL Cholesterol (Calc) 110 <130 mg/dL (calc)  HgB H0Q  Result Value Ref Range   Hgb A1c MFr Bld 5.8 (H) <5.7 % of total Hgb   Mean Plasma Glucose 120 (calc)   eAG (mmol/L) 6.6 (calc)  Vitamin D (25 hydroxy)  Result Value Ref Range   Vit D, 25-Hydroxy 25 (L) 30 - 100 ng/mL  POCT urinalysis dipstick  Result Value Ref Range   Color, UA yellow    Clarity, UA clear    Glucose, UA neg    Bilirubin, UA neg    Ketones, UA neg    Spec Grav, UA 1.010 1.010 - 1.025   Blood, UA neg    pH, UA 7.0 5.0 - 8.0   Protein, UA neg    Urobilinogen, UA 0.2 0.2 or 1.0 E.U./dL   Nitrite, UA neg    Leukocytes, UA Negative Negative  Pap IG and HPV (high risk) DNA detection  Result Value Ref Range   Clinical Information:     LMP:     PREV. PAP:     PREV. BX:     HPV DNA Probe-Source     STATEMENT OF ADEQUACY:     INTERPRETATION/RESULT:     INFECTION:     Comment:     CYTOTECHNOLOGIST:     REVIEW CYTOTECHNOLOGIST:     HPV  DNA High Risk Detected (A) Not Detect   Ct Knee Right Wo Contrast  Result Date: 05/08/2017 CLINICAL DATA:  47 year old female with right knee pain. EXAM: CT OF THE right KNEE WITHOUT CONTRAST TECHNIQUE: Multidetector CT imaging of the right knee was performed according to the standard protocol. Multiplanar CT image reconstructions were also generated. COMPARISON:  Right knee radiograph dated 05/08/2017 FINDINGS: Bones/Joint/Cartilage There is no acute fracture or dislocation. No arthritic changes. There is a small joint effusion. Ligaments Suboptimally assessed by CT. Muscles and Tendons No acute findings.  No intramuscular hematoma. Soft tissues There is a 23 x 9 mm fluid attenuating density in the posterior knee most consistent with a Baker's cyst. Superficial varicose veins noted. IMPRESSION: 1. No acute fracture or dislocation. 2. Small joint effusion. 3. Small Baker's cyst and dilated superficial varices. Electronically Signed   By:  Elgie CollardArash  Radparvar M.D.   On: 05/08/2017 20:32   [place] Venous Img Lower Unilateral Right  Result Date: 05/08/2017 CLINICAL DATA:  Right lower extremity pain and swelling EXAM: RIGHT LOWER EXTREMITY VENOUS DOPPLER ULTRASOUND TECHNIQUE: Gray-scale sonography with graded compression, as well as color Doppler and duplex ultrasound were performed to evaluate the lower extremity deep venous systems from the level of the common femoral vein and including the common femoral, femoral, profunda femoral, popliteal and calf veins including the posterior tibial, peroneal and gastrocnemius veins when visible. The superficial great saphenous vein was also interrogated. Spectral Doppler was utilized to evaluate flow at rest and with distal augmentation maneuvers in the common femoral, femoral and popliteal veins. COMPARISON:  None. FINDINGS: Contralateral Common Femoral Vein: Respiratory phasicity is normal and symmetric with the symptomatic side. No evidence of thrombus. Normal compressibility.  Common Femoral Vein: No evidence of thrombus. Normal compressibility, respiratory phasicity and response to augmentation. Saphenofemoral Junction: No evidence of thrombus. Normal compressibility and flow on color Doppler imaging. Profunda Femoral Vein: No evidence of thrombus. Normal compressibility and flow on color Doppler imaging. Femoral Vein: No evidence of thrombus. Normal compressibility, respiratory phasicity and response to augmentation. Popliteal Vein: No evidence of thrombus. Normal compressibility, respiratory phasicity and response to augmentation. Calf Veins: No evidence of thrombus. Normal compressibility and flow on color Doppler imaging. Superficial Great Saphenous Vein: Thrombus is noted with lack of compressibility. Varicosities are noted with thrombus within as well. Venous Reflux:  None. Other Findings:  None. IMPRESSION: Acute thrombus within the greater saphenous vein and varicosities arising from the greater saphenous vein. No deep venous thrombosis is noted. Electronically Signed   By: Alcide CleverMark  Lukens M.D.   On: 05/08/2017 18:50   Dg Knee Complete 4 Views Right  Result Date: 05/08/2017 CLINICAL DATA:  Right knee pain.  Injury 2 weeks ago EXAM: RIGHT KNEE - COMPLETE 4+ VIEW COMPARISON:  None. FINDINGS: Normal alignment. No fracture. Joint spaces normal. Moderate joint effusion IMPRESSION: Joint effusion without acute injury or degenerative change. Electronically Signed   By: Marlan Palauharles  Clark M.D.   On: 05/08/2017 19:28

## 2017-05-08 NOTE — ED Provider Notes (Signed)
[facility] Emergency Department Provider Note  ____________________________________________  Time seen: Approximately 9:35 PM  I have reviewed the triage vital signs and the nursing notes.   HISTORY  Chief Complaint Leg Pain    HPI Jamie Fuentes is a 47 y.o. female comes to the ED due to worsening pain and swelling of the right leg around the knee. This is been gradually worsening for the past week. It all started when she was at work and a heavy object fell onto her knee at the knee. She had pain at the time, followed up with her worker's comp provider, had a negative x-ray, told to do usual activity. However, her symptoms have gradually worsened. She denies fever chills chest pain or shortness of breath, but has pain with weightbearing or movement. Pain is nonradiating, severe, aching. No alleviating factors. Has taken pain medications as provided by the urgent care provider she saw.     Past Medical History:  Diagnosis Date  . Hypertension      Patient Active Problem List   Diagnosis Date Noted  . Class 3 severe obesity due to excess calories with serious comorbidity and body mass index (BMI) of 40.0 to 44.9 in adult (HCC) 10/16/2016  . Avitaminosis D 11/11/2015  . 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 Hypertension 08/25/2014     Past Surgical History:  Procedure Laterality Date  . CESAREAN SECTION       Prior to Admission medications   Medication Sig Start Date End Date Taking? Authorizing Provider  amLODipine (NORVASC) 10 MG tablet Take 1 tablet (10 mg total) by mouth daily. 12/21/16   Margaretann Loveless, PA-C  cephALEXin (KEFLEX) 500 MG capsule Take 1 capsule (500 mg total) by mouth 3 (three) times daily. 05/08/17   Sharman Cheek, MD  lisinopril-hydrochlorothiazide (PRINZIDE,ZESTORETIC) 10-12.5 MG tablet Take 1 tablet by mouth daily. 12/21/16   Margaretann Loveless, PA-C  metroNIDAZOLE (FLAGYL) 500 MG tablet Take 1 tablet (500 mg total)  by mouth 2 (two) times daily. 12/21/16   Margaretann Loveless, PA-C  Multiple Vitamin tablet Take by mouth.    [provider]  naproxen (NAPROSYN) 500 MG tablet Take 1 tablet (500 mg total) by mouth 2 (two) times daily with a meal. 05/08/17   Sharman Cheek, MD  Vitamin D, Ergocalciferol, (DRISDOL) 50000 units CAPS capsule Take 1 capsule (50,000 Units total) by mouth every 7 (seven) days. 12/22/16   Margaretann Loveless, PA-C     Allergies Lac bovis and Sulfa antibiotics   Family History  Problem Relation Age of Onset  . Diabetes Father   . Hypertension Father   . Thyroid cancer Sister   . Hypertension Brother   . Breast cancer Neg Hx     Social History Social History   Tobacco Use  . Smoking status: Never Smoker  . Smokeless tobacco: Never Used  Substance Use Topics  . Alcohol use: Yes    Comment: rare  . Drug use: No    Review of Systems  Constitutional:   No fever or chills.   Cardiovascular:   No chest pain or syncope. Respiratory:   No dyspnea or cough. Gastrointestinal:   Negative for abdominal pain, vomiting and diarrhea.  Musculoskeletal:  Right knee pain as above All other systems reviewed and are negative except as documented above in ROS and HPI.  ____________________________________________   PHYSICAL EXAM:  VITAL SIGNS: ED Triage Vitals [05/08/17 1709]  Enc Vitals Group     BP (!) 159/87  Pulse Rate (!) 109     Resp 18     Temp 98.6 F (37 C)     Temp Source Oral     SpO2 100 %     Weight 240 lb (108.9 kg)     Height 5\' 4"  (1.626 m)     Head Circumference      Peak Flow      Pain Score 9     Pain Loc      Pain Edu?      Excl. in GC?     Vital signs reviewed, nursing assessments reviewed.   Constitutional:   Alert and oriented. Well appearing and in no distress. Eyes:   No scleral icterus.  EOMI. ENT   Head:   Normocephalic and atraumatic.   Cardiovascular:   RRR. Symmetric bilateral radial and DP pulses.  No  murmurs.  Respiratory:   Normal respiratory effort without tachypnea/retractions. Breath sounds are clear and equal bilaterally. No wheezes/rales/rhonchi. Gastrointestinal:   Soft and nontender. Non distended. There is no CVA tenderness.  No rebound, rigidity, or guarding. Genitourinary:   deferred Musculoskeletal:   Normal range of motion in all extremities. No joint effusions. There is tenderness diffusely about the joint line in joint space of the right knee. No severe pain with range of motion. The lateral aspect of the distal knee has a irregular border erythematous patch that feels indurated and very tender to the touch and warm. No palpable cord. Negative Homans sign. Calf Circumference is symmetric Neurologic:   Normal speech and language.  Motor grossly intact. No acute focal neurologic deficits are appreciated.  Skin:    Skin is warm, dry with erythematous changes as noted above at the proximal tibia laterally  ____________________________________________    LABS (pertinent positives/negatives) (all labs ordered are listed, but only abnormal results are displayed) Labs Reviewed - No data to display ____________________________________________   EKG    ____________________________________________    RADIOLOGY  Ct Knee Right Wo Contrast  Result Date: 05/08/2017 CLINICAL DATA:  47 year old female with right knee pain. EXAM: CT OF THE right KNEE WITHOUT CONTRAST TECHNIQUE: Multidetector CT imaging of the right knee was performed according to the standard protocol. Multiplanar CT image reconstructions were also generated. COMPARISON:  Right knee radiograph dated 05/08/2017 FINDINGS: Bones/Joint/Cartilage There is no acute fracture or dislocation. No arthritic changes. There is a small joint effusion. Ligaments Suboptimally assessed by CT. Muscles and Tendons No acute findings.  No intramuscular hematoma. Soft tissues There is a 23 x 9 mm fluid attenuating density in the posterior  knee most consistent with a Baker's cyst. Superficial varicose veins noted. IMPRESSION: 1. No acute fracture or dislocation. 2. Small joint effusion. 3. Small Baker's cyst and dilated superficial varices. Electronically Signed   By: Elgie Collard M.D.   On: 05/08/2017 20:32   US Venous Img Lower Unilateral Right  Result Date: 05/08/2017 CLINICAL DATA:  Right lower extremity pain and swelling EXAM: RIGHT LOWER EXTREMITY VENOUS DOPPLER ULTRASOUND TECHNIQUE: Gray-scale sonography with graded compression, as well as color Doppler and duplex ultrasound were performed to evaluate the lower extremity deep venous systems from the level of the common femoral vein and including the common femoral, femoral, profunda femoral, popliteal and calf veins including the posterior tibial, peroneal and gastrocnemius veins when visible. The superficial great saphenous vein was also interrogated. Spectral Doppler was utilized to evaluate flow at rest and with distal augmentation maneuvers in the common femoral, femoral and popliteal veins. COMPARISON:  None.  FINDINGS: Contralateral Common Femoral Vein: Respiratory phasicity is normal and symmetric with the symptomatic side. No evidence of thrombus. Normal compressibility. Common Femoral Vein: No evidence of thrombus. Normal compressibility, respiratory phasicity and response to augmentation. Saphenofemoral Junction: No evidence of thrombus. Normal compressibility and flow on color Doppler imaging. Profunda Femoral Vein: No evidence of thrombus. Normal compressibility and flow on color Doppler imaging. Femoral Vein: No evidence of thrombus. Normal compressibility, respiratory phasicity and response to augmentation. Popliteal Vein: No evidence of thrombus. Normal compressibility, respiratory phasicity and response to augmentation. Calf Veins: No evidence of thrombus. Normal compressibility and flow on color Doppler imaging. Superficial Great Saphenous Vein: Thrombus is noted with  lack of compressibility. Varicosities are noted with thrombus within as well. Venous Reflux:  None. Other Findings:  None. IMPRESSION: Acute thrombus within the greater saphenous vein and varicosities arising from the greater saphenous vein. No deep venous thrombosis is noted. Electronically Signed   By: Alcide CleverMark  Lukens M.D.   On: 05/08/2017 18:50   Dg Knee Complete 4 Views Right  Result Date: 05/08/2017 CLINICAL DATA:  Right knee pain.  Injury 2 weeks ago EXAM: RIGHT KNEE - COMPLETE 4+ VIEW COMPARISON:  None. FINDINGS: Normal alignment. No fracture. Joint spaces normal. Moderate joint effusion IMPRESSION: Joint effusion without acute injury or degenerative change. Electronically Signed   By: Marlan Palauharles  Clark M.D.   On: 05/08/2017 19:28    ____________________________________________   PROCEDURES Procedures  ____________________________________________    CLINICAL IMPRESSION / ASSESSMENT AND PLAN / ED COURSE  Pertinent labs & imaging results that were available during my care of the patient were reviewed by me and considered in my medical decision making (see chart for details).     Clinical Course as of May 08 2133  Tue May 08, 2017  1905 Patient presents with severe right knee pain after trauma from a falling object right onto the knee joint. Clinically she has inflammatory changes at the lateral aspect of the knee consistent with cellulitis versus thrombophlebitis. Ultrasound of the leg reveals a saphenous thrombus. With her tenderness around the joint space in other areas, concerned about an occult knee fracture. I'll repeat the knee x-ray. If negative, I will get a CT scan of the knee to ensure she doesn't have a tibial plateau fracture. If the rest of the imaging is reassuring, I think the patient can be discharged with NSAIDs, heat therapy, Keflex.  [PS]  1947 Knee xray neg for fx. Shows effusion. Will CT for occult Fx.  [PS]  2132 CT negative for fracture. Baker's cyst identified.   [PS]    Clinical Course User Index [PS] Sharman CheekStafford, Rishikesh Khachatryan, MD    ----------------------------------------- 9:40 PM on 05/08/2017 -----------------------------------------  Patient counseled on NSAIDs, heat therapy, follow-up for reevaluation and possible repeat ultrasound in 1 week. Given the pronounced inflammatory response on the lateral knee, and concerned that she may be developing cellulitis as well from compromise of the skin barrier from blunt trauma. Therefore I'll have her take Keflex. She is afebrile and not in distress and not septic, so limited oral therapy should be sufficient.   ____________________________________________   FINAL CLINICAL IMPRESSION(S) / ED DIAGNOSES    Final diagnoses:  Thrombophlebitis of superficial veins of right lower extremity  Baker cyst, right  Cellulitis of right lower extremity     ED Discharge Orders        Ordered    cephALEXin (KEFLEX) 500 MG capsule  3 times daily     05/08/17 2134  naproxen (NAPROSYN) 500 MG tablet  2 times daily with meals     05/08/17 2134      Portions of this note were generated with dragon dictation software. Dictation errors may occur despite best attempts at proofreading.    Sharman Cheek, MD 05/08/17 2141

## 2017-06-28 ENCOUNTER — Other Ambulatory Visit (INDEPENDENT_AMBULATORY_CARE_PROVIDER_SITE_OTHER): Payer: Self-pay | Admitting: Vascular Surgery

## 2017-06-28 DIAGNOSIS — I8393 Asymptomatic varicose veins of bilateral lower extremities: Secondary | ICD-10-CM

## 2017-07-04 ENCOUNTER — Ambulatory Visit (INDEPENDENT_AMBULATORY_CARE_PROVIDER_SITE_OTHER): Payer: Worker's Compensation

## 2017-07-04 ENCOUNTER — Encounter (INDEPENDENT_AMBULATORY_CARE_PROVIDER_SITE_OTHER): Payer: Self-pay | Admitting: Vascular Surgery

## 2017-07-04 ENCOUNTER — Ambulatory Visit (INDEPENDENT_AMBULATORY_CARE_PROVIDER_SITE_OTHER): Payer: Worker's Compensation | Admitting: Vascular Surgery

## 2017-07-04 VITALS — BP 135/86 | HR 78 | Resp 16 | Ht 64.5 in | Wt 263.0 lb

## 2017-07-04 DIAGNOSIS — Z8672 Personal history of thrombophlebitis: Secondary | ICD-10-CM | POA: Insufficient documentation

## 2017-07-04 DIAGNOSIS — I8393 Asymptomatic varicose veins of bilateral lower extremities: Secondary | ICD-10-CM | POA: Diagnosis not present

## 2017-07-04 DIAGNOSIS — I8001 Phlebitis and thrombophlebitis of superficial vessels of right lower extremity: Secondary | ICD-10-CM | POA: Diagnosis not present

## 2017-07-04 DIAGNOSIS — I890 Lymphedema, not elsewhere classified: Secondary | ICD-10-CM | POA: Diagnosis not present

## 2017-07-04 NOTE — Progress Notes (Signed)
Subjective:    Patient ID: Jamie Fuentes, female    DOB: 02/24/1971, 47 y.o.   MRN: [id number] Chief Complaint  Patient presents with  . New Patient (Initial Visit)    Reflux u/s    Patient presents as a new patient referred by the [facility] emergency department after being seen on May 08, 2017.  The patient was seen in our emergency department after sustaining a trauma to her right leg.  The patient endorses a box falling and hitting the lateral aspect of her right lower extremity just distal to her right knee.  The patient was diagnosed with cellulitis and superficial thrombophlebitis of the right lower extremity.  She was treated with Keflex, anti-inflammatories, warm compresses, compression and elevation.  At this time the patient does not engage in conservative therapy including wearing medical grade 1 compression socks.  The patient notes that she does elevate her legs.  A workman's compensation nurse case manager was present.  Kavin Leech [phone]).  The patient notes after the trauma now experiencing knots to the right upper thigh.  The patient denies any claudication-like symptoms rest pain or ulceration to the bilateral lower extremity.  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  No venous reflux was noted in the deep or superficial system.  There is no evidence of deep vein or any other new superficial thrombosis.  The patient denies any fever, nausea or vomiting.  Review of Systems  Constitutional: Negative.   HENT: Negative.   Eyes: Negative.   Respiratory: Negative.   Cardiovascular: Positive for leg swelling.  Gastrointestinal: Negative.   Endocrine: Negative.   Genitourinary: Negative.   Musculoskeletal:  Negative.   Skin: Negative.   Allergic/Immunologic: Negative.   Neurological: Negative.   Hematological: Negative.   Psychiatric/Behavioral: Negative.       Objective:   Physical Exam  Constitutional: She is oriented to person, place, and time. She appears well-developed and well-nourished. No distress.  HENT:  Head: Normocephalic and atraumatic.  Right Ear: External ear normal.  Left Ear: External ear normal.  Eyes: Pupils are equal, round, and reactive to light. Conjunctivae and EOM are normal.  Neck: Normal range of motion.  Cardiovascular: Normal rate and regular rhythm.  Pulmonary/Chest: Effort normal and breath sounds normal.  Musculoskeletal: Normal range of motion. She exhibits edema (Mild to moderate nonpitting edema noted bilaterally).  Neurological: She is alert and oriented to person, place, and time.  Skin: Skin is warm and dry. She is not diaphoretic.  Less than 1 cm scattered varicosities noted to the bilateral lower extremity.  There is no stasis dermatitis, skin thickening, cellulitis, active ulcerations noted to the bilateral lower extremity.  Psychiatric: She has a normal mood and affect. Her behavior is normal. Judgment and thought content normal.  Vitals reviewed.  BP 135/86 (BP Location: Left Arm, Patient Position: Sitting)   Pulse 78   Resp 16   Ht 5' 4.5" (1.638 m)   Wt 263 lb (119.3 kg)   BMI 44.45 kg/m   Past Medical History:  Diagnosis Date  . Colitis, ulcerative chronic ([place])   . Hypertension    Social History   Socioeconomic History  . Marital status: Married    Spouse name: Not on file  . Number of children:  Not on file  . Years of education: Not on file  . Highest education level: Not on file  Occupational History  . Not on file  Social Needs  . Financial resource strain: Not on file  . Food insecurity:    Worry: Not on file    Inability: Not on file  . Transportation needs:    Medical: Not on file    Non-medical: Not on file    Tobacco Use  . Smoking status: Never Smoker  . Smokeless tobacco: Never Used  Substance and Sexual Activity  . Alcohol use: Yes    Comment: rare  . Drug use: No  . Sexual activity: Not on file  Lifestyle  . Physical activity:    Days per week: Not on file    Minutes per session: Not on file  . Stress: Not on file  Relationships  . Social connections:    Talks on phone: Not on file    Gets together: Not on file    Attends religious service: Not on file    Active member of club or organization: Not on file    Attends meetings of clubs or organizations: Not on file    Relationship status: Not on file  . Intimate partner violence:    Fear of current or ex partner: Not on file    Emotionally abused: Not on file    Physically abused: Not on file    Forced sexual activity: Not on file  Other Topics Concern  . Not on file  Social History Narrative  . Not on file   Past Surgical History:  Procedure Laterality Date  . CESAREAN SECTION     Family History  Problem Relation Age of Onset  . Diabetes Father   . Hypertension Father   . Thyroid cancer Sister   . Hypertension Brother   . Breast cancer Neg Hx    Allergies  Allergen Reactions  . Lac Bovis   . Sulfa Antibiotics Nausea Only, Rash and Nausea And Vomiting      Assessment & Plan:  Patient presents as a new patient referred by the [facility] emergency department after being seen on May 08, 2017.  The patient was seen in our emergency department after sustaining a trauma to her right leg.  The patient endorses a box falling and hitting the lateral aspect of her right lower extremity just distal to her right knee.  The patient was diagnosed with cellulitis and superficial thrombophlebitis of the right lower extremity.  She was treated with Keflex, anti-inflammatories, warm compresses, compression and elevation.  At this time the patient does not engage in conservative therapy including wearing medical  grade 1 compression socks.  The patient notes that she does elevate her legs.  A workman's compensation nurse case manager was present.  Kavin Leech [phone]).  The patient notes after the trauma now experiencing knots to the right upper thigh.  The patient denies any claudication-like symptoms rest pain or ulceration to the bilateral lower extremity.  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  No venous reflux was noted in the deep or superficial system.  There is no evidence of deep vein or any other new superficial thrombosis.  The patient denies any fever, nausea or vomiting.  1. Lymphedema - New Patient does experience bilateral lower extremity edema. Patient does not have any evidence of venous reflux to the deep or superficial system on duplex today Studies reviewed with the patient. I spent time discussing her duplex results, the difference between venous insufficiency vs lymphedema and why her swelling is not venous in origin. I discussed lymphedema and what symptoms it causes and how to manage them. The patient was encouraged to wear graduated compression stockings (20-30 mmHg) on a daily basis. The patient was instructed to begin wearing the stockings first thing in the morning and removing them in the evening. The patient was instructed specifically not to sleep in the stockings. Prescription given. In addition, behavioral modification including elevation during the day will be initiated. We discussed a lymphedema pump if conventional therapy goes not work. The patient was advised to follow up in one month after wearing her compression stockings daily with elevation. Information on compression stockings, lymphedema and the lymphedema pump was given to  the patient. The patient was instructed to call the office in the interim if any worsening edema or ulcerations to the legs, feet or toes occurs. The patient expresses their understanding.  2. 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 The lateral aspect is where she sustained trauma when a box at work hit her. 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  There is no acute vascular compromise to the leg.  At this time, there is no indication for any new medication/procedure.   I explained to the patient that the body at this point has to reabsorb the thrombosis.  This can take weeks to months.   I did encourage the patient to start wearing compression socks and to continue elevating her legs. The patient and the Workmen's Comp. nurse manager was given a copy of her venous duplex.  Current Outpatient Medications on File Prior to Visit  Medication Sig Dispense Refill  . amLODipine (NORVASC) 10 MG tablet Take 1 tablet (10 mg total) by mouth daily. 90 tablet 3  . lisinopril-hydrochlorothiazide (PRINZIDE,ZESTORETIC) 10-12.5 MG tablet Take 1 tablet by mouth daily. 90 tablet 3  . naproxen (NAPROSYN) 500 MG tablet Take 1 tablet (500 mg total) by mouth 2 (two) times daily with a meal. 20 tablet 0  . cephALEXin (KEFLEX) 500 MG capsule Take 1 capsule (500 mg total) by mouth 3 (three) times daily. (Patient not taking: Reported on 07/04/2017) 21 capsule 0  . metroNIDAZOLE (FLAGYL) 500 MG tablet Take 1 tablet (500 mg total) by mouth 2 (two) times daily. (Patient not taking: Reported on 07/04/2017) 14 tablet 0  . Multiple Vitamin tablet Take by mouth.    . Vitamin D, Ergocalciferol, (DRISDOL) 50000 units CAPS capsule Take 1 capsule (50,000 Units total) by mouth every 7 (seven) days. (Patient not taking: Reported on 07/04/2017) 12 capsule 1   No  current facility-administered medications on file prior to visit.    There are no Patient Instructions on file for this visit. No follow-ups on file.  Jadelin Eng A Otilia Kareem, PA-C

## 2017-10-31 ENCOUNTER — Ambulatory Visit
Admission: RE | Admit: 2017-10-31 | Discharge: 2017-10-31 | Disposition: A | Discharge status: Home / Self Care | Payer: BLUE CROSS/BLUE SHIELD | Source: Ambulatory Visit | Attending: Physician Assistant | Admitting: Physician Assistant

## 2017-10-31 DIAGNOSIS — Z1231 Encounter for screening mammogram for malignant neoplasm of breast: Secondary | ICD-10-CM | POA: Insufficient documentation

## 2017-10-31 DIAGNOSIS — Z1239 Encounter for other screening for malignant neoplasm of breast: Secondary | ICD-10-CM

## 2017-11-01 ENCOUNTER — Telehealth: Payer: Self-pay

## 2017-11-01 NOTE — Telephone Encounter (Signed)
-----   Message from Margaretann Loveless, PA-C sent at 10/31/2017  9:48 PM EDT ----- Normal mammogram. Repeat in one year.

## 2017-11-01 NOTE — Telephone Encounter (Signed)
Patient advised as below.  

## 2017-12-22 ENCOUNTER — Other Ambulatory Visit: Payer: Self-pay | Admitting: Physician Assistant

## 2017-12-22 DIAGNOSIS — I10 Essential (primary) hypertension: Secondary | ICD-10-CM

## 2018-01-02 ENCOUNTER — Ambulatory Visit (INDEPENDENT_AMBULATORY_CARE_PROVIDER_SITE_OTHER): Payer: BLUE CROSS/BLUE SHIELD | Admitting: Physician Assistant

## 2018-01-02 ENCOUNTER — Encounter: Payer: Self-pay | Admitting: Physician Assistant

## 2018-01-02 ENCOUNTER — Other Ambulatory Visit (HOSPITAL_COMMUNITY)
Admission: RE | Admit: 2018-01-02 | Discharge: 2018-01-02 | Disposition: A | Discharge status: Home / Self Care | Payer: BLUE CROSS/BLUE SHIELD | Source: Ambulatory Visit | Attending: Physician Assistant | Admitting: Physician Assistant

## 2018-01-02 VITALS — BP 140/88 | HR 77 | Temp 97.6°F | Resp 16 | Ht 64.5 in | Wt 263.2 lb

## 2018-01-02 DIAGNOSIS — R87810 Cervical high risk human papillomavirus (HPV) DNA test positive: Secondary | ICD-10-CM | POA: Diagnosis not present

## 2018-01-02 DIAGNOSIS — R7309 Other abnormal glucose: Secondary | ICD-10-CM

## 2018-01-02 DIAGNOSIS — Z0000 Encounter for general adult medical examination without abnormal findings: Secondary | ICD-10-CM | POA: Insufficient documentation

## 2018-01-02 DIAGNOSIS — Z23 Encounter for immunization: Secondary | ICD-10-CM | POA: Diagnosis not present

## 2018-01-02 DIAGNOSIS — B9689 Other specified bacterial agents as the cause of diseases classified elsewhere: Secondary | ICD-10-CM

## 2018-01-02 DIAGNOSIS — N760 Acute vaginitis: Secondary | ICD-10-CM

## 2018-01-02 DIAGNOSIS — Z6841 Body Mass Index (BMI) 40.0 and over, adult: Secondary | ICD-10-CM

## 2018-01-02 DIAGNOSIS — Z13220 Encounter for screening for lipoid disorders: Secondary | ICD-10-CM

## 2018-01-02 DIAGNOSIS — Z136 Encounter for screening for cardiovascular disorders: Secondary | ICD-10-CM

## 2018-01-02 MED ORDER — PHENTERMINE HCL 37.5 MG PO TABS: 37.5000 mg | ORAL_TABLET | Freq: Every day | ORAL | 0 refills | Status: DC

## 2018-01-02 MED ORDER — METRONIDAZOLE 0.75 % VA GEL: VAGINAL | 0 refills | Status: DC

## 2018-01-02 NOTE — Patient Instructions (Signed)

## 2018-01-02 NOTE — Progress Notes (Signed)
[name], Female    DOB: [birth year], 47 y.o.   MRN: [id number] Visit Date: 01/02/2018  Today's Provider: Mar Daring, PA-C   Chief Complaint  Patient presents with  . Annual Exam   Subjective:    Annual physical exam Jamie Fuentes is a 47 y.o. female who presents today for health maintenance and complete physical. She feels fairly well. She reports exercising. She reports she is sleeping poorly.  Last CPE:12/21/16 Mammogram:10/31/17 BI-RADS 1 Colonoscopy: 05/18/14 Pap:12/21/16-Abnormal-referral placed for GYN ----------------------------------------------------------------- Patient c/o obesity. She reports that she interested on trying something to help with her weight. She reports that she just don't feel good about her self. Reports that she goes to the gym.   Review of Systems  Constitutional: Positive for fatigue.       Irritability  HENT: Negative.   Eyes: Negative.   Respiratory: Positive for shortness of breath.   Cardiovascular: Negative.   Gastrointestinal: Negative.   Endocrine: Negative.   Genitourinary: Negative.   Musculoskeletal: Positive for arthralgias.  Skin: Negative.   Allergic/Immunologic: Negative.   Neurological: Positive for numbness.  Hematological: Negative.   Psychiatric/Behavioral: Positive for agitation and sleep disturbance.  All other systems reviewed and are negative.   Social History      She  reports that she has never smoked. She has never used smokeless tobacco. She reports that she drinks alcohol. She reports that she does not use drugs.       Social History   Socioeconomic History  . Marital status: Married    Spouse name: Not on file  . Number of children: Not on file  . Years of education: Not on file  . Highest education level: Not on file  Occupational History  . Not on file  Social Needs  . Financial resource strain: Not on file  . Food insecurity:    Worry: Not on file   Inability: Not on file  . Transportation needs:    Medical: Not on file    Non-medical: Not on file  Tobacco Use  . Smoking status: Never Smoker  . Smokeless tobacco: Never Used  Substance and Sexual Activity  . Alcohol use: Yes    Comment: rare  . Drug use: No  . Sexual activity: Not on file  Lifestyle  . Physical activity:    Days per week: Not on file    Minutes per session: Not on file  . Stress: Not on file  Relationships  . Social connections:    Talks on phone: Not on file    Gets together: Not on file    Attends religious service: Not on file    Active member of club or organization: Not on file    Attends meetings of clubs or organizations: Not on file    Relationship status: Not on file  Other Topics Concern  . Not on file  Social History Narrative  . Not on file    Past Medical History:  Diagnosis Date  . Colitis, ulcerative chronic ([place])   . Hypertension      Patient Active Problem List   Diagnosis Date Noted  . Lymphedema 07/04/2017  . 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 Class 3 severe obesity due to excess calories with serious comorbidity and body mass index (BMI) of 40.0 to 44.9 in adult ([place]) 10/16/2016  . Avitaminosis D 11/11/2015  . 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 Hypertension 08/25/2014    Past  Surgical History:  Procedure Laterality Date  . CESAREAN SECTION      Family History        Family Status  Relation Name Status  . Mother  Alive  . Father  Alive  . Sister  Alive  . Brother  Alive  . Brother  Alive  . Neg Hx  (Not Specified)        Her family history includes Diabetes in her father; Hypertension in her brother and father; Thyroid cancer in her sister. There is no history of Breast cancer.      Allergies  Allergen Reactions  . Lac Bovis   . Sulfa Antibiotics Nausea Only, Rash and Nausea And Vomiting     Current Outpatient Medications:  .  amLODipine (NORVASC) 10 MG tablet,  TAKE 1 TABLET DAILY, Disp: 90 tablet, Rfl: 0 .  lisinopril-hydrochlorothiazide (PRINZIDE,ZESTORETIC) 10-12.5 MG tablet, TAKE 1 TABLET DAILY, Disp: 90 tablet, Rfl: 0 .  VITAMIN D PO, Take by mouth., Disp: , Rfl:    Patient Care Team: Mar Daring, PA-C as PCP - General (Family Medicine)      Objective:   Vitals: BP 140/88 (BP Location: Right Wrist, Patient Position: Sitting, Cuff Size: Normal)   Pulse 77   Temp 97.6 F (36.4 C) (Oral)   Resp 16   Ht 5' 4.5" (1.638 m)   Wt 263 lb 3.2 oz (119.4 kg)   SpO2 98%   BMI 44.48 kg/m    Vitals:   01/02/18 1507  BP: 140/88  Pulse: 77  Resp: 16  Temp: 97.6 F (36.4 C)  TempSrc: Oral  SpO2: 98%  Weight: 263 lb 3.2 oz (119.4 kg)  Height: 5' 4.5" (1.638 m)     Physical Exam  Constitutional: She is oriented to person, place, and time. She appears well-developed and well-nourished. No distress.  HENT:  Head: Normocephalic and atraumatic.  Right Ear: Hearing, tympanic membrane, external ear and ear canal normal.  Left Ear: Hearing, tympanic membrane, external ear and ear canal normal.  Nose: Nose normal.  Mouth/Throat: Uvula is midline, oropharynx is clear and moist and mucous membranes are normal. No oropharyngeal exudate.  Eyes: Pupils are equal, round, and reactive to light. Conjunctivae and EOM are normal. Right eye exhibits no discharge. Left eye exhibits no discharge. No scleral icterus.  Neck: Normal range of motion. Neck supple. No JVD present. Carotid bruit is not present. No tracheal deviation present. No thyromegaly present.  Cardiovascular: Normal rate, regular rhythm, normal heart sounds and intact distal pulses. Exam reveals no gallop and no friction rub.  No murmur heard. Pulmonary/Chest: Effort normal and breath sounds normal. No respiratory distress. She has no wheezes. She has no rales. She exhibits no tenderness. Right breast exhibits no inverted nipple, no mass, no nipple discharge, no skin change and no  tenderness. Left breast exhibits no inverted nipple, no mass, no nipple discharge, no skin change and no tenderness. No breast tenderness, discharge or bleeding. Breasts are symmetrical.  Abdominal: Soft. Bowel sounds are normal. She exhibits no distension and no mass. There is no tenderness. There is no rebound and no guarding. Hernia confirmed negative in the right inguinal area and confirmed negative in the left inguinal area.  Genitourinary: Rectum normal, vagina normal and uterus normal. No breast tenderness, discharge or bleeding. Pelvic exam was performed with patient supine. There is no rash, tenderness, lesion or injury on the right labia. There is no rash, tenderness, lesion or injury on the left labia. Cervix exhibits  no motion tenderness, no discharge and no friability. Right adnexum displays no mass, no tenderness and no fullness. Left adnexum displays no mass, no tenderness and no fullness. No erythema, tenderness or bleeding in the vagina. No signs of injury around the vagina. No vaginal discharge found.  Musculoskeletal: Normal range of motion. She exhibits no edema or tenderness.  Lymphadenopathy:    She has no cervical adenopathy.       Right: No inguinal adenopathy present.       Left: No inguinal adenopathy present.  Neurological: She is alert and oriented to person, place, and time. She has normal reflexes. No cranial nerve deficit. Coordination normal.  Skin: Skin is warm and dry. No rash noted. She is not diaphoretic.  Psychiatric: She has a normal mood and affect. Her behavior is normal. Judgment and thought content normal.  Vitals reviewed.    Depression Screen PHQ 2/9 Scores 01/02/2018 12/21/2016 04/27/2016  PHQ - 2 Score 1 0 0  PHQ- 9 Score - 1 1      Assessment & Plan:     Routine Health Maintenance and Physical Exam  Exercise Activities and Dietary recommendations Goals   None     Immunization History  Administered Date(s) Administered  . Influenza Split  02/10/2010, 02/13/2011, 04/05/2012  . Influenza,inj,Quad PF,6+ Mos 11/11/2015, 12/21/2016  . Tdap 04/30/2006    Health Maintenance  Topic Date Due  . TETANUS/TDAP  04/29/2016  . INFLUENZA VACCINE  09/27/2017  . PAP SMEAR  12/22/2019  . HIV Screening  Discontinued     Discussed health benefits of physical activity, and encouraged her to engage in regular exercise appropriate for her age and condition.    1. Annual physical exam Normal physical exam today. Will check labs as below and f/u pending lab results. If labs are stable and WNL she will not need to have these rechecked for one year at her next annual physical exam. She is to call the office in the meantime if she has any acute issue, questions or concerns. - CBC with Differential/Platelet - Comprehensive metabolic panel - TSH  2. Cervical high risk HPV (human papillomavirus) test positive Last year was positive for HPV, negative changes. Pap collected today. Will send as below and f/u pending results. If negative will need to repeat annually x 2 more years, to total 3 normal paps then can return to regular screenings. If still positive will need to refer to GYN for colposcopy.  - Cytology - PAP  3. Need for influenza vaccination Flu vaccine given today without complication. Patient sat upright for 15 minutes to check for adverse reaction before being released. - Flu Vaccine QUAD 36+ mos IM  4. Elevated hemoglobin A1c Will check labs as below and f/u pending results. - Hemoglobin A1c  5. Encounter for lipid screening for cardiovascular disease Will check labs as below and f/u pending results. - Lipid panel  6. Need for tetanus booster Td booster Vaccine given to patient without complications. Patient sat for 15 minutes after administration and was tolerated well without adverse effects. - Td : Tetanus/diphtheria >45yo Preservative  free  7. BMI 40.0-44.9, adult ([place]) Discussed weight loss including adding a food diary,  limiting to 1200-1400 calories daily. Also continue regular exercise and increase as tolerated. Phentermine will be given as below for appetite suppression. I will see her back in 4 weeks for weight recheck.  - phentermine (ADIPEX-P) 37.5 MG tablet; Take 1 tablet (37.5 mg total) by mouth daily before breakfast.  Dispense: 30 tablet; Refill: 0  8. Morbidly obese ([place]) See above medical treatment plan. - phentermine (ADIPEX-P) 37.5 MG tablet; Take 1 tablet (37.5 mg total) by mouth daily before breakfast.  Dispense: 30 tablet; Refill: 0  9. BV (bacterial vaginosis) Gets recurrent infections following menstrual cycle. Will start metrogel as below to use daily prn after menstrual cycles for prevention.  - metroNIDAZOLE (METROGEL) 0.75 % vaginal gel; Use one applicator dose daily prn after menstrual cycle  Dispense: 70 g; Refill: 0  --------------------------------------------------------------------    Mar Daring, PA-C  [facility]

## 2018-01-03 DIAGNOSIS — Z13220 Encounter for screening for lipoid disorders: Secondary | ICD-10-CM | POA: Diagnosis not present

## 2018-01-03 DIAGNOSIS — R7309 Other abnormal glucose: Secondary | ICD-10-CM | POA: Diagnosis not present

## 2018-01-03 DIAGNOSIS — Z0000 Encounter for general adult medical examination without abnormal findings: Secondary | ICD-10-CM | POA: Diagnosis not present

## 2018-01-03 DIAGNOSIS — Z136 Encounter for screening for cardiovascular disorders: Secondary | ICD-10-CM | POA: Diagnosis not present

## 2018-01-04 LAB — COMPREHENSIVE METABOLIC PANEL
ALBUMIN: 4.3 g/dL (ref 3.5–5.5)
ALK PHOS: 52 IU/L (ref 39–117)
ALT: 27 IU/L (ref 0–32)
AST: 19 IU/L (ref 0–40)
Albumin/Globulin Ratio: 1.8 (ref 1.2–2.2)
BILIRUBIN TOTAL: 0.4 mg/dL (ref 0.0–1.2)
BUN / CREAT RATIO: 25 — AB (ref 9–23)
BUN: 17 mg/dL (ref 6–24)
CHLORIDE: 101 mmol/L (ref 96–106)
CO2: 23 mmol/L (ref 20–29)
Calcium: 9.5 mg/dL (ref 8.7–10.2)
Creatinine, Ser: 0.69 mg/dL (ref 0.57–1.00)
GFR calc Af Amer: 122 mL/min/{1.73_m2} (ref >59)
GFR calc non Af Amer: 105 mL/min/{1.73_m2} (ref >59)
GLUCOSE: 124 mg/dL — AB (ref 65–99)
Globulin, Total: 2.4 g/dL (ref 1.5–4.5)
Potassium: 3.6 mmol/L (ref 3.5–5.2)
Sodium: 141 mmol/L (ref 134–144)
Total Protein: 6.7 g/dL (ref 6.0–8.5)

## 2018-01-04 LAB — CBC WITH DIFFERENTIAL/PLATELET
BASOS ABS: 0 10*3/uL (ref 0.0–0.2)
Basos: 0 %
EOS (ABSOLUTE): 0.1 10*3/uL (ref 0.0–0.4)
EOS: 1 %
Hematocrit: 40 % (ref 34.0–46.6)
Hemoglobin: 13.2 g/dL (ref 11.1–15.9)
IMMATURE GRANULOCYTES: 1 %
Immature Grans (Abs): 0 10*3/uL (ref 0.0–0.1)
Lymphocytes Absolute: 2.2 10*3/uL (ref 0.7–3.1)
Lymphs: 26 %
MCH: 28.1 pg (ref 26.6–33.0)
MCHC: 33 g/dL (ref 31.5–35.7)
MCV: 85 fL (ref 79–97)
MONOS ABS: 0.4 10*3/uL (ref 0.1–0.9)
Monocytes: 5 %
Neutrophils Absolute: 5.6 10*3/uL (ref 1.4–7.0)
Neutrophils: 67 %
PLATELETS: 341 10*3/uL (ref 150–450)
RBC: 4.69 x10E6/uL (ref 3.77–5.28)
RDW: 14.1 % (ref 12.3–15.4)
WBC: 8.4 10*3/uL (ref 3.4–10.8)

## 2018-01-04 LAB — HEMOGLOBIN A1C
ESTIMATED AVERAGE GLUCOSE: 128 mg/dL
Hgb A1c MFr Bld: 6.1 % — ABNORMAL HIGH (ref 4.8–5.6)

## 2018-01-04 LAB — LIPID PANEL
CHOLESTEROL TOTAL: 177 mg/dL (ref 100–199)
Chol/HDL Ratio: 4 ratio (ref 0.0–4.4)
HDL: 44 mg/dL (ref >39)
LDL Calculated: 109 mg/dL — ABNORMAL HIGH (ref 0–99)
TRIGLYCERIDES: 121 mg/dL (ref 0–149)
VLDL CHOLESTEROL CAL: 24 mg/dL (ref 5–40)

## 2018-01-04 LAB — TSH: TSH: 2.36 u[IU]/mL (ref 0.450–4.500)

## 2018-01-08 LAB — CYTOLOGY - PAP
DIAGNOSIS: NEGATIVE
HPV: NOT DETECTED

## 2018-01-09 ENCOUNTER — Telehealth: Payer: Self-pay

## 2018-01-09 NOTE — Telephone Encounter (Signed)
Patient was advised.  

## 2018-01-09 NOTE — Telephone Encounter (Signed)
-----   Message from Margaretann LovelessJennifer M Burnette, PA-C sent at 01/09/2018  7:43 AM EST ----- Pap is normal, HPV negative.  Will repeat in 3-5 years.

## 2018-02-04 ENCOUNTER — Encounter: Payer: Self-pay | Admitting: Physician Assistant

## 2018-02-04 ENCOUNTER — Ambulatory Visit: Payer: BLUE CROSS/BLUE SHIELD | Admitting: Physician Assistant

## 2018-02-04 DIAGNOSIS — Z6841 Body Mass Index (BMI) 40.0 and over, adult: Secondary | ICD-10-CM

## 2018-02-04 MED ORDER — PHENTERMINE HCL 37.5 MG PO TABS: 37.5000 mg | ORAL_TABLET | Freq: Every day | ORAL | 0 refills | Status: DC

## 2018-02-04 NOTE — Progress Notes (Signed)
[name] Female    DOB: Apr 11, 1970   47 y.o.   MRN: [id number] Visit Date: 02/04/2018  Today's Provider: Margaretann LovelessJennifer M Malita Ignasiak, PA-C   Chief Complaint  Patient presents with  . Follow-up   Subjective:    HPI  Follow up for obesity  The patient was last seen for this 1 months ago. Changes made at last visit include start phentermine.  She reports excellent compliance with treatment. She feels that condition is Improved. She is not having side effects.  ------------------------------------------------------------------------------------  Wt Readings from Last 3 Encounters:  02/04/18 249 lb 12.8 oz (113.3 kg)  01/02/18 263 lb 3.2 oz (119.4 kg)  07/04/17 263 lb (119.3 kg)       Allergies  Allergen Reactions  . Lac Bovis   . Sulfa Antibiotics Nausea Only, Rash and Nausea And Vomiting    Current Outpatient Medications:  .  amLODipine (NORVASC) 10 MG tablet, TAKE 1 TABLET DAILY, Disp: 90 tablet, Rfl: 0 .  lisinopril-hydrochlorothiazide (PRINZIDE,ZESTORETIC) 10-12.5 MG tablet, TAKE 1 TABLET DAILY, Disp: 90 tablet, Rfl: 0 .  metroNIDAZOLE (METROGEL) 0.75 % vaginal gel, Use one applicator dose daily prn after menstrual cycle, Disp: 70 g, Rfl: 0 .  phentermine (ADIPEX-P) 37.5 MG tablet, Take 1 tablet (37.5 mg total) by mouth daily before breakfast., Disp: 30 tablet, Rfl: 0 .  VITAMIN D PO, Take by mouth., Disp: , Rfl:   Review of Systems  Constitutional: Negative.   HENT: Negative.   Respiratory: Negative.   Cardiovascular: Negative.   Gastrointestinal: Negative.   Neurological: Negative.     Social History   Tobacco Use  . Smoking status: Never Smoker  . Smokeless tobacco: Never Used  Substance Use Topics  . Alcohol use: Yes    Comment: rare   Objective:   BP 124/84 (BP Location: Left Arm, Patient Position: Sitting, Cuff Size: Large)   Pulse 83   Temp 97.8 F (36.6 C) (Oral)   Resp 16   Ht 5' 4.5" (1.638 m)   Wt 249 lb 12.8 oz (113.3  kg)   SpO2 97%   BMI 42.22 kg/m  Vitals:   02/04/18 1548  BP: 124/84  Pulse: 83  Resp: 16  Temp: 97.8 F (36.6 C)  TempSrc: Oral  SpO2: 97%  Weight: 249 lb 12.8 oz (113.3 kg)  Height: 5' 4.5" (1.638 m)     Physical Exam  Constitutional: She appears well-developed and well-nourished. No distress.  Neck: Normal range of motion. Neck supple.  Cardiovascular: Normal rate, regular rhythm and normal heart sounds. Exam reveals no gallop and no friction rub.  No murmur heard. Pulmonary/Chest: Effort normal and breath sounds normal. No respiratory distress. She has no wheezes. She has no rales.  Skin: She is not diaphoretic.  Vitals reviewed.       Assessment & Plan:     1. BMI 40.0-44.9, adult [facility](HCC) Patient doing well. Went from 263 pounds to 249 pounds. No complaints. Continue phentermine as below (30 day sent to local pharmacy, 90 day supply to mail order -patient will not get until after January). Continue food diary. Start to increase exercise as tolerated. I will see her back in 3-4 months for weight recheck.  - phentermine (ADIPEX-P) 37.5 MG tablet; Take 1 tablet (37.5 mg total) by mouth daily before breakfast.  Dispense: 90 tablet; Refill: 0 - phentermine (ADIPEX-P) 37.5 MG tablet; Take 1 tablet (37.5 mg total) by mouth daily before breakfast.  Dispense: 30 tablet; Refill:  0  2. Morbidly obese (HCC) See above medical treatment plan. - phentermine (ADIPEX-P) 37.5 MG tablet; Take 1 tablet (37.5 mg total) by mouth daily before breakfast.  Dispense: 90 tablet; Refill: 0 - phentermine (ADIPEX-P) 37.5 MG tablet; Take 1 tablet (37.5 mg total) by mouth daily before breakfast.  Dispense: 30 tablet; Refill: 0       Margaretann Loveless, PA-C  [facility]

## 2018-02-08 ENCOUNTER — Telehealth: Payer: Self-pay

## 2018-02-08 DIAGNOSIS — Z6841 Body Mass Index (BMI) 40.0 and over, adult: Secondary | ICD-10-CM

## 2018-02-08 MED ORDER — PHENTERMINE HCL 37.5 MG PO TABS: 37.5000 mg | ORAL_TABLET | Freq: Every day | ORAL | 2 refills | Status: DC

## 2018-02-08 NOTE — Telephone Encounter (Signed)
Sent in

## 2018-02-08 NOTE — Telephone Encounter (Signed)
Patient called to let Antony ContrasJenni know that phentermine was denied by mail order pharmacy, and she would like Jenni to send 90 day supply to local pharmacy [facility] instead. Please review. Thanks!

## 2018-03-09 ENCOUNTER — Telehealth: Payer: Self-pay | Admitting: Physician Assistant

## 2018-03-09 DIAGNOSIS — I10 Essential (primary) hypertension: Secondary | ICD-10-CM

## 2018-03-09 NOTE — Telephone Encounter (Signed)
Patient needs Amlodipine 10 mg. And Lisinopril/HCTZ 10-12.5 sent to [facility] order

## 2018-03-11 MED ORDER — AMLODIPINE BESYLATE 10 MG PO TABS: 10.0000 mg | ORAL_TABLET | Freq: Every day | ORAL | 1 refills | Status: DC

## 2018-03-11 MED ORDER — LISINOPRIL-HYDROCHLOROTHIAZIDE 10-12.5 MG PO TABS: 1.0000 | ORAL_TABLET | Freq: Every day | ORAL | 1 refills | Status: DC

## 2018-03-11 NOTE — Telephone Encounter (Signed)
Refills sent

## 2018-03-25 ENCOUNTER — Other Ambulatory Visit: Payer: Self-pay | Admitting: Physician Assistant

## 2018-03-25 DIAGNOSIS — I10 Essential (primary) hypertension: Secondary | ICD-10-CM

## 2018-03-26 ENCOUNTER — Other Ambulatory Visit: Payer: Self-pay

## 2018-03-26 DIAGNOSIS — Z6841 Body Mass Index (BMI) 40.0 and over, adult: Secondary | ICD-10-CM

## 2018-03-26 NOTE — Telephone Encounter (Signed)
Patient requesting the following medication to send to [facility] in  [employer] phentermine (ADIPEX-P) 37.5 MG tablet   Patient reports that you had sent this Rx [facility] and wasn't able to start it because her son has been in the hospital.

## 2018-03-27 MED ORDER — PHENTERMINE HCL 37.5 MG PO TABS: 37.5000 mg | ORAL_TABLET | Freq: Every day | ORAL | 2 refills | Status: DC

## 2018-10-21 ENCOUNTER — Other Ambulatory Visit: Payer: Self-pay | Admitting: Physician Assistant

## 2018-10-21 ENCOUNTER — Other Ambulatory Visit: Payer: Self-pay

## 2018-10-21 DIAGNOSIS — Z1231 Encounter for screening mammogram for malignant neoplasm of breast: Secondary | ICD-10-CM

## 2018-10-24 ENCOUNTER — Other Ambulatory Visit: Payer: Self-pay

## 2018-10-24 ENCOUNTER — Encounter: Payer: Self-pay | Admitting: Physician Assistant

## 2018-10-24 ENCOUNTER — Ambulatory Visit: Payer: Self-pay | Admitting: Physician Assistant

## 2018-10-24 VITALS — BP 125/80 | HR 92 | Temp 97.7°F | Resp 16 | Ht 64.5 in | Wt 237.8 lb

## 2018-10-24 DIAGNOSIS — Z6841 Body Mass Index (BMI) 40.0 and over, adult: Secondary | ICD-10-CM | POA: Diagnosis not present

## 2018-10-24 DIAGNOSIS — H6123 Impacted cerumen, bilateral: Secondary | ICD-10-CM

## 2018-10-24 MED ORDER — PHENTERMINE HCL 37.5 MG PO TABS: 37.5000 mg | ORAL_TABLET | Freq: Every day | ORAL | 2 refills | Status: DC

## 2018-10-24 NOTE — Progress Notes (Signed)
[name] Female    DOB: August 05, 1970   48 y.o.   MRN: [id number] Visit Date: 10/24/2018  Today's Provider: Mar Daring, PA-C   Chief Complaint  Patient presents with  . Dizziness   Subjective:     Subjective:  Jamie Fuentes is a 48 y.o. female who I am asked to see in consultation for evaluation of dizziness.  The dizziness has been present for 5 days. The patient describes the symptoms as lightheadedness. Symptoms are exacerbated by none identified The patient also complains of none. Patient denies otalgia tinnitus.  She has been treated with nothing.   Dizziness is described as room spinning and feelings of off-balance "like when you just get off a rollercoaster". Can occur at any position and sometimes even sitting still. Not described as only occurring with head movements. Reports it has been occurring for about a month or so. Does report flying prior to onset 2 months ago.   Allergies  Allergen Reactions  . Lac Bovis   . Sulfa Antibiotics Nausea Only, Rash and Nausea And Vomiting     Current Outpatient Medications:  .  amLODipine (NORVASC) 10 MG tablet, TAKE 1 TABLET DAILY, Disp: 90 tablet, Rfl: 4 .  lisinopril-hydrochlorothiazide (PRINZIDE,ZESTORETIC) 10-12.5 MG tablet, TAKE 1 TABLET DAILY, Disp: 90 tablet, Rfl: 4 .  metroNIDAZOLE (METROGEL) 0.75 % vaginal gel, Use one applicator dose daily prn after menstrual cycle (Patient not taking: Reported on 10/24/2018), Disp: 70 g, Rfl: 0 .  phentermine (ADIPEX-P) 37.5 MG tablet, Take 1 tablet (37.5 mg total) by mouth daily before breakfast. (Patient not taking: Reported on 10/24/2018), Disp: 30 tablet, Rfl: 2 .  VITAMIN D PO, Take by mouth., Disp: , Rfl:   Review of Systems  Constitutional: Negative.   HENT: Negative for congestion, ear discharge, ear pain, hearing loss, postnasal drip, sinus pressure, sinus pain, sneezing, sore throat and tinnitus.   Eyes: Negative for visual disturbance.   Respiratory: Negative.   Cardiovascular: Negative.   Neurological: Positive for dizziness. Negative for headaches.    Social History   Tobacco Use  . Smoking status: Never Smoker  . Smokeless tobacco: Never Used  Substance Use Topics  . Alcohol use: Yes    Comment: rare      Objective:   BP 125/80 (BP Location: Left Arm, Patient Position: Sitting, Cuff Size: Large)   Pulse 92   Temp 97.7 F (36.5 C) (Temporal)   Resp 16   Ht 5' 4.5" (1.638 m)   Wt 237 lb 12.8 oz (107.9 kg)   SpO2 97%   BMI 40.19 kg/m  Vitals:   10/24/18 0814  BP: 125/80  Pulse: 92  Resp: 16  Temp: 97.7 F (36.5 C)  TempSrc: Temporal  SpO2: 97%  Weight: 237 lb 12.8 oz (107.9 kg)  Height: 5' 4.5" (1.638 m)     Physical Exam Vitals signs reviewed.  Constitutional:      General: She is not in acute distress.    Appearance: Normal appearance. She is well-developed. She is obese. She is not ill-appearing or diaphoretic.  HENT:     Head: Normocephalic and atraumatic.     Right Ear: Hearing, tympanic membrane, ear canal and external ear normal. There is impacted cerumen.     Left Ear: Hearing, tympanic membrane, ear canal and external ear normal. There is impacted cerumen.     Nose: Nose normal.     Mouth/Throat:     Mouth: Mucous membranes are moist.  Pharynx: Uvula midline. No oropharyngeal exudate.  Eyes:     General: No scleral icterus.       Right eye: No discharge.        Left eye: No discharge.     Extraocular Movements: Extraocular movements intact.     Right eye: No nystagmus.     Left eye: No nystagmus.     Conjunctiva/sclera: Conjunctivae normal.     Pupils: Pupils are equal, round, and reactive to light.  Neck:     Musculoskeletal: Normal range of motion and neck supple.     Thyroid: No thyromegaly.     Trachea: No tracheal deviation.  Cardiovascular:     Rate and Rhythm: Normal rate and regular rhythm.     Heart sounds: Normal heart sounds. No murmur. No friction rub. No  gallop.   Pulmonary:     Effort: Pulmonary effort is normal. No respiratory distress.     Breath sounds: Normal breath sounds. No stridor. No wheezing or rales.  Lymphadenopathy:     Cervical: No cervical adenopathy.  Skin:    General: Skin is warm and dry.     Capillary Refill: Capillary refill takes less than 2 seconds.  Neurological:     General: No focal deficit present.     Mental Status: She is alert and oriented to person, place, and time. Mental status is at baseline.     Cranial Nerves: No cranial nerve deficit.     Sensory: No sensory deficit.     Motor: No weakness.     Coordination: Coordination normal.     Gait: Gait normal.  Psychiatric:        Mood and Affect: Mood normal.        Behavior: Behavior normal.        Thought Content: Thought content normal.        Judgment: Judgment normal.      No results found for any visits on 10/24/18.     Assessment & Plan    1. Bilateral impacted cerumen Ear lavage successful. TM visualized and normal. Suspect may be cause of dizzy episodes. Advised to call if still occurring.  - Ear Lavage  2. BMI 40.0-44.9, adult (HCC) Will restart phentermine. Had used successfully in Jan-March.  - phentermine (ADIPEX-P) 37.5 MG tablet; Take 1 tablet (37.5 mg total) by mouth daily before breakfast.  Dispense: 30 tablet; Refill: 2  3. Morbidly obese (HCC) See above medical treatment plan. - phentermine (ADIPEX-P) 37.5 MG tablet; Take 1 tablet (37.5 mg total) by mouth daily before breakfast.  Dispense: 30 tablet; Refill: 2     Margaretann LovelessJennifer M Caulin Begley, PA-C  [facility]

## 2018-10-24 NOTE — Patient Instructions (Signed)
Earwax Buildup, Adult The ears produce a substance called earwax that helps keep bacteria out of the ear and protects the skin in the ear canal. Occasionally, earwax can build up in the ear and cause discomfort or hearing loss. What increases the risk? This condition is more likely to develop in people who:  Are female.  Are elderly.  Naturally produce more earwax.  Clean their ears often with cotton swabs.  Use earplugs often.  Use in-ear headphones often.  Wear hearing aids.  Have narrow ear canals.  Have earwax that is overly thick or sticky.  Have eczema.  Are dehydrated.  Have excess hair in the ear canal. What are the signs or symptoms? Symptoms of this condition include:  Reduced or muffled hearing.  A feeling of fullness in the ear or feeling that the ear is plugged.  Fluid coming from the ear.  Ear pain.  Ear itch.  Ringing in the ear.  Coughing.  An obvious piece of earwax that can be seen inside the ear canal. How is this diagnosed? This condition may be diagnosed based on:  Your symptoms.  Your medical history.  An ear exam. During the exam, your health care provider will look into your ear with an instrument called an otoscope. You may have tests, including a hearing test. How is this treated? This condition may be treated by:  Using ear drops to soften the earwax.  Having the earwax removed by a health care provider. The health care provider may: ? Flush the ear with water. ? Use an instrument that has a loop on the end (curette). ? Use a suction device.  Surgery to remove the wax buildup. This may be done in severe cases. Follow these instructions at home:   Take over-the-counter and prescription medicines only as told by your health care provider.  Do not put any objects, including cotton swabs, into your ear. You can clean the opening of your ear canal with a washcloth or facial tissue.  Follow instructions from your health care  provider about cleaning your ears. Do not over-clean your ears.  Drink enough fluid to keep your urine clear or pale yellow. This will help to thin the earwax.  Keep all follow-up visits as told by your health care provider. If earwax builds up in your ears often or if you use hearing aids, consider seeing your health care provider for routine, preventive ear cleanings. Ask your health care provider how often you should schedule your cleanings.  If you have hearing aids, clean them according to instructions from the manufacturer and your health care provider. Contact a health care provider if:  You have ear pain.  You develop a fever.  You have blood, pus, or other fluid coming from your ear.  You have hearing loss.  You have ringing in your ears that does not go away.  Your symptoms do not improve with treatment.  You feel like the room is spinning (vertigo). Summary  Earwax can build up in the ear and cause discomfort or hearing loss.  The most common symptoms of this condition include reduced or muffled hearing and a feeling of fullness in the ear or feeling that the ear is plugged.  This condition may be diagnosed based on your symptoms, your medical history, and an ear exam.  This condition may be treated by using ear drops to soften the earwax or by having the earwax removed by a health care provider.  Do not put any   objects, including cotton swabs, into your ear. You can clean the opening of your ear canal with a washcloth or facial tissue. This information is not intended to replace advice given to you by your health care provider. Make sure you discuss any questions you have with your health care provider. Document Released: 03/23/2004 Document Revised: 01/26/2017 Document Reviewed: 04/26/2016 Elsevier Patient Education  2020 Elsevier Inc[employer]  

## 2018-11-21 ENCOUNTER — Ambulatory Visit
Admission: RE | Admit: 2018-11-21 | Discharge: 2018-11-21 | Disposition: A | Discharge status: Home / Self Care | Payer: PRIVATE HEALTH INSURANCE | Source: Ambulatory Visit | Attending: Physician Assistant | Admitting: Physician Assistant

## 2018-11-21 DIAGNOSIS — Z1231 Encounter for screening mammogram for malignant neoplasm of breast: Secondary | ICD-10-CM | POA: Insufficient documentation

## 2018-11-22 ENCOUNTER — Telehealth: Payer: Self-pay

## 2018-11-22 NOTE — Telephone Encounter (Signed)
Patient notified of results.

## 2018-11-22 NOTE — Telephone Encounter (Signed)
-----   Message from Mar Daring, [place] sent at 11/22/2018 10:05 AM EDT ----- Normal mammogram. Repeat screening in one year.'

## 2018-11-22 NOTE — Telephone Encounter (Signed)
LMTCB 11/22/2018  Thanks,   -Rajvir Ernster  

## 2019-01-08 ENCOUNTER — Encounter: Payer: BLUE CROSS/BLUE SHIELD | Admitting: Physician Assistant

## 2019-01-20 ENCOUNTER — Ambulatory Visit: Payer: Self-pay

## 2019-01-20 NOTE — Telephone Encounter (Signed)
From [facility] 

## 2019-01-20 NOTE — Telephone Encounter (Signed)
Patient called stating that she has had hives since the end of October.  She states that she had a virtual visit with doctor through her insurance company and the doctor took her off of all BP medications and gave her medrol dose pak that she finished 1 week ago.  The hives are large areas all over her body. They come up everywhere except her feet. She need to take benadryl to control the hives and itching. She has not started her BP medications.  She has not noted and symptoms of hypertension.  Care advice read to patient. She verbalized understanding. Call transferred to office for scheduling.  Reason for Disposition . [1] MODERATE-SEVERE hives persist (i.e., hives interfere with normal activities or work) AND [2] taking antihistamine (e.g., Benadryl, Claritin) > 24 hours  Answer Assessment - Initial Assessment Questions 1. APPEARANCE: "What does the rash look like?"      lumps 2. LOCATION: "Where is the rash located?"      All over 3. NUMBER: "How many hives are there?"      multiple 4. SIZE: "How big are the hives?" (inches, cm, compare to coins) "Do they all look the same or is there lots of variation in shape and size?"      Yes multible 5. ONSET: "When did the hives begin?" (Hours or days ago)     End of october 6. ITCHING: "Does it itch?" If so, ask: "How bad is the itch?"    - MILD: doesn't interfere with normal activities   - MODERATE-SEVERE: interferes with work, school, sleep, or other activities      Moderate to severe 7. RECURRENT PROBLEM: "Have you had hives before?" If so, ask: "When was the last time?" and "What happened that time?"      no 8. TRIGGERS: "Were you exposed to any new food, plant, cosmetic product or animal just before the hives began?"     nothing 9. OTHER SYMPTOMS: "Do you have any other symptoms?" (e.g., fever, tongue swelling, difficulty breathing, abdominal pain)    no 10. PREGNANCY: "Is there any chance you are pregnant?" "When was your last menstrual  period?"      No vascetomy  Protocols used: HIVES-A-AH

## 2019-01-20 NOTE — Telephone Encounter (Signed)
FYI

## 2019-01-21 ENCOUNTER — Ambulatory Visit: Payer: PRIVATE HEALTH INSURANCE | Admitting: Physician Assistant

## 2019-01-21 ENCOUNTER — Other Ambulatory Visit: Payer: Self-pay

## 2019-01-21 ENCOUNTER — Encounter: Payer: Self-pay | Admitting: Physician Assistant

## 2019-01-21 VITALS — BP 139/89 | HR 94 | Temp 96.9°F | Resp 16 | Wt 241.0 lb

## 2019-01-21 DIAGNOSIS — Z6841 Body Mass Index (BMI) 40.0 and over, adult: Secondary | ICD-10-CM | POA: Diagnosis not present

## 2019-01-21 DIAGNOSIS — I10 Essential (primary) hypertension: Secondary | ICD-10-CM

## 2019-01-21 DIAGNOSIS — L509 Urticaria, unspecified: Secondary | ICD-10-CM

## 2019-01-21 MED ORDER — PHENTERMINE HCL 37.5 MG PO TABS: 37.5000 mg | ORAL_TABLET | Freq: Every day | ORAL | 2 refills | Status: DC

## 2019-01-21 MED ORDER — HYDROXYZINE HCL 10 MG PO TABS: 10.0000 mg | ORAL_TABLET | Freq: Three times a day (TID) | ORAL | 0 refills | Status: DC | PRN

## 2019-01-21 NOTE — Patient Instructions (Signed)
Hives Hives (urticaria) are itchy, red, swollen areas on the skin. Hives can appear on any part of the body. Hives often fade within 24 hours (acute hives). Sometimes, new hives appear after old ones fade and the cycle can continue for several days or weeks (chronic hives). Hives do not spread from person to person (are not contagious). Hives come from the body's reaction to something a person is allergic to (allergen), something that causes irritation, or various other triggers. When a person is exposed to a trigger, his or her body releases a chemical (histamine) that causes redness, itching, and swelling. Hives can appear right after exposure to a trigger or hours later. What are the causes? This condition may be caused by:  Allergies to foods or ingredients.  Insect bites or stings.  Exposure to pollen or pets.  Contact with latex or chemicals.  Spending time in sunlight, heat, or cold (exposure).  Exercise.  Stress.  Certain medicines. You can also get hives from other medical conditions and treatments, such as:  Viruses, including the common cold.  Bacterial infections, such as urinary tract infections and strep throat.  Certain medicines.  Allergy shots.  Blood transfusions. Sometimes, the cause of this condition is not known (idiopathic hives). What increases the risk? You are more likely to develop this condition if you:  Are a woman.  Have food allergies, especially to citrus fruits, milk, eggs, peanuts, tree nuts, or shellfish.  Are allergic to: ? Medicines. ? Latex. ? Insects. ? Animals. ? Pollen. What are the signs or symptoms? Common symptoms of this condition include raised, itchy, red or white bumps or patches on your skin. These areas may:  Become large and swollen (welts).  Change in shape and location, quickly and repeatedly.  Be separate hives or connect over a large area of skin.  Sting or become painful.  Turn white when pressed in the  center (blanch). In severe cases, yourhands, feet, and face may also become swollen. This may occur if hives develop deeper in your skin. How is this diagnosed? This condition may be diagnosed by your symptoms, medical history, and physical exam.  Your skin, urine, or blood may be tested to find out what is causing your hives and to rule out other health issues.  Your health care provider may also remove a small sample of skin from the affected area and examine it under a microscope (biopsy). How is this treated? Treatment for this condition depends on the cause and severity of your symptoms. Your health care provider may recommend using cool, wet cloths (cool compresses) or taking cool showers to relieve itching. Treatment may include:  Medicines that help: ? Relieve itching (antihistamines). ? Reduce swelling (corticosteroids). ? Treat infection (antibiotics).  An injectable medicine (omalizumab). Your health care provider may prescribe this if you have chronic idiopathic hives and you continue to have symptoms even after treatment with antihistamines. Severe cases may require an emergency injection of adrenaline (epinephrine) to prevent a life-threatening allergic reaction (anaphylaxis). Follow these instructions at home: Medicines  Take and apply over-the-counter and prescription medicines only as told by your health care provider.  If you were prescribed an antibiotic medicine, take it as told by your health care provider. Do not stop using the antibiotic even if you start to feel better. Skin care  Apply cool compresses to the affected areas.  Do not scratch or rub your skin. General instructions  Do not take hot showers or baths. This can make itching  worse.  Do not wear tight-fitting clothing.  Use sunscreen and wear protective clothing when you are outside.  Avoid any substances that cause your hives. Keep a journal to help track what causes your hives. Write down: ?  What medicines you take. ? What you eat and drink. ? What products you use on your skin.  Keep all follow-up visits as told by your health care provider. This is important. Contact a health care provider if:  Your symptoms are not controlled with medicine.  Your joints are painful or swollen. Get help right away if:  You have a fever.  You have pain in your abdomen.  Your tongue or lips are swollen.  Your eyelids are swollen.  Your chest or throat feels tight.  You have trouble breathing or swallowing. These symptoms may represent a serious problem that is an emergency. Do not wait to see if the symptoms will go away. Get medical help right away. Call your local emergency services (911 in the [place] Do not drive yourself to the hospital. Summary  Hives (urticaria) are itchy, red, swollen areas on your skin. Hives come from the body's reaction to something a person is allergic to (allergen), something that causes irritation, or various other triggers.  Treatment for this condition depends on the cause and severity of your symptoms.  Avoid any substances that cause your hives. Keep a journal to help track what causes your hives.  Take and apply over-the-counter and prescription medicines only as told by your health care provider.  Keep all follow-up visits as told by your health care provider. This is important. This information is not intended to replace advice given to you by your health care provider. Make sure you discuss any questions you have with your health care provider. Document Released: 02/13/2005 Document Revised: 08/29/2017 Document Reviewed: 08/29/2017 Elsevier Patient Education  2020 [employer].

## 2019-01-21 NOTE — Progress Notes (Signed)
[name] Female    DOB: 11/26/1970   48 y.o.   MRN: [id number] Visit Date: 01/21/2019  Today's Provider: Mar Daring, PA-C   Chief Complaint  Patient presents with   Rash   Subjective:     Rash This is a new problem. The current episode started more than 1 month ago. The affected locations include the neck, left arm, left upper leg, right arm and right upper leg. The rash is characterized by itchiness, redness and swelling. She was exposed to nothing. Associated symptoms include eye pain, facial edema and a sore throat. Pertinent negatives include no anorexia, congestion, cough, diarrhea, fatigue, fever, joint pain, nail changes, rhinorrhea, shortness of breath or vomiting. Treatments tried: Benadryl and methylpred. The treatment provided mild relief.   Patient has had hives appear up on her body for about 1 month. Hives have appeared on neck, arms, torso, buttocks and legs. Hives are red, swollen and itchy. Patient states hives will stay on her body until she takes Benadryl. Patient had a Televisit 01/06/2019 and was given Methapred 4 mg taper. Patient also states she advised by Televisit provider to stop taking her medication. Since stopping all medications on 01/06/19 and taking the 6 day medrol dose pak and benadryl she has not had any improvement in the hives. She reports currently she has to take the benadryl every 4 hours to keep the hives calmed down. She reports that about the end of the 4th hour she will notice the warming sensation and itching starting back.   Allergies  Allergen Reactions   Lac Bovis    Sulfa Antibiotics Nausea Only, Rash and Nausea And Vomiting     Current Outpatient Medications:    amLODipine (NORVASC) 10 MG tablet, TAKE 1 TABLET DAILY (Patient not taking: Reported on 01/21/2019), Disp: 90 tablet, Rfl: 4   lisinopril-hydrochlorothiazide (PRINZIDE,ZESTORETIC) 10-12.5 MG tablet, TAKE 1 TABLET DAILY (Patient not taking:  Reported on 01/21/2019), Disp: 90 tablet, Rfl: 4   metroNIDAZOLE (METROGEL) 0.75 % vaginal gel, Use one applicator dose daily prn after menstrual cycle (Patient not taking: Reported on 10/24/2018), Disp: 70 g, Rfl: 0   phentermine (ADIPEX-P) 37.5 MG tablet, Take 1 tablet (37.5 mg total) by mouth daily before breakfast. (Patient not taking: Reported on 01/21/2019), Disp: 30 tablet, Rfl: 2   VITAMIN D PO, Take by mouth., Disp: , Rfl:   Review of Systems  Constitutional: Negative for appetite change, chills, fatigue and fever.  HENT: Positive for sore throat. Negative for congestion and rhinorrhea.   Eyes: Positive for pain.  Respiratory: Negative for cough, chest tightness and shortness of breath.   Cardiovascular: Negative for chest pain and palpitations.  Gastrointestinal: Negative for abdominal pain, anorexia, diarrhea, nausea and vomiting.  Musculoskeletal: Negative for joint pain.  Skin: Positive for rash. Negative for nail changes.  Neurological: Negative for dizziness and weakness.    Social History   Tobacco Use   Smoking status: Never Smoker   Smokeless tobacco: Never Used  Substance Use Topics   Alcohol use: Yes    Comment: rare      Objective:   BP 139/89 (BP Location: Left Arm, Patient Position: Sitting, Cuff Size: Large)    Pulse 94    Temp (!) 96.9 F (36.1 C) (Other (Comment))    Resp 16    Wt 241 lb (109.3 kg)    SpO2 95%    BMI 40.73 kg/m  Vitals:   01/21/19 1444  BP: 139/89  Pulse: 94  Resp: 16  Temp: (!) 96.9 F (36.1 C)  TempSrc: Other (Comment)  SpO2: 95%  Weight: 241 lb (109.3 kg)  Body mass index is 40.73 kg/m.   Physical Exam Vitals signs reviewed.  Constitutional:      General: She is not in acute distress.    Appearance: Normal appearance. She is well-developed. She is obese. She is not ill-appearing or diaphoretic.  Neck:     Musculoskeletal: Normal range of motion and neck supple.  Cardiovascular:     Rate and Rhythm: Normal rate  and regular rhythm.     Pulses: Normal pulses.     Heart sounds: Normal heart sounds. No murmur. No friction rub. No gallop.   Pulmonary:     Effort: Pulmonary effort is normal. No respiratory distress.     Breath sounds: Normal breath sounds. No wheezing or rales.  Skin:    Comments: Normal currently but patient shows pictures of clusters of hives on her upper inner thighs bilaterally and on her arms and chest  Neurological:     Mental Status: She is alert.      No results found for any visits on 01/21/19.     Assessment & Plan    1. Essential hypertension Currently stable. Continue to hold BP medications until she is seen on 02/10/19 unless her BP starts to become elevated (>140/90). She is to call if her BP remains elevated or if she starts developing changes like headache or vision changes.   2. Hives Currently using Benadryl without complete relief. Will change to Hydroxyzine as below. Advised to add pepcid daily, may increase to BID if needed. Call by next week if not improving and will change therapy to doxepin and get labs.  - hydrOXYzine (ATARAX/VISTARIL) 10 MG tablet; Take 1 tablet (10 mg total) by mouth 3 (three) times daily as needed.  Dispense: 90 tablet; Refill: 0  3. BMI 40.0-44.9, adult [facility]) Counseled patient on healthy lifestyle modifications including dieting and exercise.  Restart phentermine once hives clear. Reports never got the August prescription.  - phentermine (ADIPEX-P) 37.5 MG tablet; Take 1 tablet (37.5 mg total) by mouth daily before breakfast.  Dispense: 30 tablet; Refill: 2  4. Morbidly obese ([place]) See above medical treatment plan. - phentermine (ADIPEX-P) 37.5 MG tablet; Take 1 tablet (37.5 mg total) by mouth daily before breakfast.  Dispense: 30 tablet; Refill: [place], PA-C  [facility]

## 2019-02-10 ENCOUNTER — Encounter: Payer: PRIVATE HEALTH INSURANCE | Admitting: Physician Assistant

## 2019-03-13 ENCOUNTER — Other Ambulatory Visit (HOSPITAL_COMMUNITY)
Admission: RE | Admit: 2019-03-13 | Discharge: 2019-03-13 | Disposition: A | Discharge status: Home / Self Care | Payer: BLUE CROSS/BLUE SHIELD | Source: Ambulatory Visit | Attending: Physician Assistant | Admitting: Physician Assistant

## 2019-03-13 ENCOUNTER — Other Ambulatory Visit: Payer: Self-pay

## 2019-03-13 ENCOUNTER — Encounter: Payer: Self-pay | Admitting: Physician Assistant

## 2019-03-13 ENCOUNTER — Ambulatory Visit (INDEPENDENT_AMBULATORY_CARE_PROVIDER_SITE_OTHER): Payer: BC Managed Care – PPO | Admitting: Physician Assistant

## 2019-03-13 VITALS — BP 170/97 | HR 84 | Temp 96.3°F | Resp 18 | Ht 64.0 in | Wt 246.4 lb

## 2019-03-13 DIAGNOSIS — Z124 Encounter for screening for malignant neoplasm of cervix: Secondary | ICD-10-CM | POA: Diagnosis not present

## 2019-03-13 DIAGNOSIS — E559 Vitamin D deficiency, unspecified: Secondary | ICD-10-CM

## 2019-03-13 DIAGNOSIS — L509 Urticaria, unspecified: Secondary | ICD-10-CM | POA: Diagnosis not present

## 2019-03-13 DIAGNOSIS — Z0000 Encounter for general adult medical examination without abnormal findings: Secondary | ICD-10-CM | POA: Diagnosis not present

## 2019-03-13 DIAGNOSIS — I10 Essential (primary) hypertension: Secondary | ICD-10-CM

## 2019-03-13 DIAGNOSIS — R7303 Prediabetes: Secondary | ICD-10-CM

## 2019-03-13 DIAGNOSIS — Z6841 Body Mass Index (BMI) 40.0 and over, adult: Secondary | ICD-10-CM

## 2019-03-13 LAB — POCT URINALYSIS DIPSTICK
Bilirubin, UA: NEGATIVE
Blood, UA: NEGATIVE
Glucose, UA: NEGATIVE
Ketones, UA: NEGATIVE
Leukocytes, UA: NEGATIVE
Nitrite, UA: NEGATIVE
Protein, UA: NEGATIVE
Spec Grav, UA: 1.02 (ref 1.010–1.025)
Urobilinogen, UA: 0.2 E.U./dL
pH, UA: 6 (ref 5.0–8.0)

## 2019-03-13 MED ORDER — AMLODIPINE BESYLATE 10 MG PO TABS: 10.0000 mg | ORAL_TABLET | Freq: Every day | ORAL | 4 refills | Status: DC

## 2019-03-13 MED ORDER — DOXEPIN HCL 10 MG PO CAPS: 10.0000 mg | ORAL_CAPSULE | Freq: Three times a day (TID) | ORAL | 0 refills | Status: DC

## 2019-03-13 NOTE — Patient Instructions (Signed)
Health Maintenance, Female Adopting a healthy lifestyle and getting preventive care are important in promoting health and wellness. Ask your health care provider about:  The right schedule for you to have regular tests and exams.  Things you can do on your own to prevent diseases and keep yourself healthy. What should I know about diet, weight, and exercise? Eat a healthy diet   Eat a diet that includes plenty of vegetables, fruits, low-fat dairy products, and lean protein.  Do not eat a lot of foods that are high in solid fats, added sugars, or sodium. Maintain a healthy weight Body mass index (BMI) is used to identify weight problems. It estimates body fat based on height and weight. Your health care provider can help determine your BMI and help you achieve or maintain a healthy weight. Get regular exercise Get regular exercise. This is one of the most important things you can do for your health. Most adults should:  Exercise for at least 150 minutes each week. The exercise should increase your heart rate and make you sweat (moderate-intensity exercise).  Do strengthening exercises at least twice a week. This is in addition to the moderate-intensity exercise.  Spend less time sitting. Even light physical activity can be beneficial. Watch cholesterol and blood lipids Have your blood tested for lipids and cholesterol at 47 years of age, then have this test every 5 years. Have your cholesterol levels checked more often if:  Your lipid or cholesterol levels are high.  You are older than 47 years of age.  You are at high risk for heart disease. What should I know about cancer screening? Depending on your health history and family history, you may need to have cancer screening at various ages. This may include screening for:  Breast cancer.  Cervical cancer.  Colorectal cancer.  Skin cancer.  Lung cancer. What should I know about heart disease, diabetes, and high blood  pressure? Blood pressure and heart disease  High blood pressure causes heart disease and increases the risk of stroke. This is more likely to develop in people who have high blood pressure readings, are of African descent, or are overweight.  Have your blood pressure checked: ? Every 3-5 years if you are 18-39 years of age. ? Every year if you are 40 years old or older. Diabetes Have regular diabetes screenings. This checks your fasting blood sugar level. Have the screening done:  Once every three years after age 40 if you are at a normal weight and have a low risk for diabetes.  More often and at a younger age if you are overweight or have a high risk for diabetes. What should I know about preventing infection? Hepatitis B If you have a higher risk for hepatitis B, you should be screened for this virus. Talk with your health care provider to find out if you are at risk for hepatitis B infection. Hepatitis C Testing is recommended for:  Everyone born from 1945 through 1965.  Anyone with known risk factors for hepatitis C. Sexually transmitted infections (STIs)  Get screened for STIs, including gonorrhea and chlamydia, if: ? You are sexually active and are younger than 47 years of age. ? You are older than 47 years of age and your health care provider tells you that you are at risk for this type of infection. ? Your sexual activity has changed since you were last screened, and you are at increased risk for chlamydia or gonorrhea. Ask your health care provider if   you are at risk.  Ask your health care provider about whether you are at high risk for HIV. Your health care provider may recommend a prescription medicine to help prevent HIV infection. If you choose to take medicine to prevent HIV, you should first get tested for HIV. You should then be tested every 3 months for as long as you are taking the medicine. Pregnancy  If you are about to stop having your period (premenopausal) and  you may become pregnant, seek counseling before you get pregnant.  Take 400 to 800 micrograms (mcg) of folic acid every day if you become pregnant.  Ask for birth control (contraception) if you want to prevent pregnancy. Osteoporosis and menopause Osteoporosis is a disease in which the bones lose minerals and strength with aging. This can result in bone fractures. If you are 65 years old or older, or if you are at risk for osteoporosis and fractures, ask your health care provider if you should:  Be screened for bone loss.  Take a calcium or vitamin D supplement to lower your risk of fractures.  Be given hormone replacement therapy (HRT) to treat symptoms of menopause. Follow these instructions at home: Lifestyle  Do not use any products that contain nicotine or tobacco, such as cigarettes, e-cigarettes, and chewing tobacco. If you need help quitting, ask your health care provider.  Do not use street drugs.  Do not share needles.  Ask your health care provider for help if you need support or information about quitting drugs. Alcohol use  Do not drink alcohol if: ? Your health care provider tells you not to drink. ? You are pregnant, may be pregnant, or are planning to become pregnant.  If you drink alcohol: ? Limit how much you use to 0-1 drink a day. ? Limit intake if you are breastfeeding.  Be aware of how much alcohol is in your drink. In the [place] one drink equals one 12 oz bottle of beer (355 mL), one 5 oz glass of wine (148 mL), or one 1 oz glass of hard liquor (44 mL). General instructions  Schedule regular health, dental, and eye exams.  Stay current with your vaccines.  Tell your health care provider if: ? You often feel depressed. ? You have ever been abused or do not feel safe at home. Summary  Adopting a healthy lifestyle and getting preventive care are important in promoting health and wellness.  Follow your health care provider's instructions about healthy  diet, exercising, and getting tested or screened for diseases.  Follow your health care provider's instructions on monitoring your cholesterol and blood pressure. This information is not intended to replace advice given to you by your health care provider. Make sure you discuss any questions you have with your health care provider. Document Revised: 02/06/2018 Document Reviewed: 02/06/2018 Elsevier Patient Education  2020 Elsevier Inc.  

## 2019-03-13 NOTE — Progress Notes (Signed)
[name], Female    DOB: 04-21-1970, 49 y.o.   MRN: [id number] Visit Date: 03/13/2019  Today's Provider: Mar Daring, PA-C   Chief Complaint  Patient presents with  . Annual Exam   Subjective:     Annual physical exam Jamie Fuentes is a 49 y.o. female who presents today for health maintenance and complete physical. She feels well. She reports exercising walking. She reports she is sleeping poorly. ----------------------------------------------------------------- PAP: 01/02/2018 Normal Repeat in 3-5 years MAM: 11/22/2018 Bi Rads 1 Colonoscopy : 05/10/2014 Normal   Review of Systems  Constitutional: Negative.   HENT: Negative.   Eyes: Negative.   Respiratory: Negative.   Cardiovascular: Negative.   Gastrointestinal: Negative.   Endocrine: Negative.   Genitourinary: Negative.   Musculoskeletal: Negative.   Skin: Positive for rash (hives).  Allergic/Immunologic: Negative.   Neurological: Negative.   Hematological: Negative.   Psychiatric/Behavioral: Negative.     Social History      She  reports that she has never smoked. She has never used smokeless tobacco. She reports current alcohol use. She reports that she does not use drugs.       Social History   Socioeconomic History  . Marital status: Married    Spouse name: Not on file  . Number of children: Not on file  . Years of education: Not on file  . Highest education level: Not on file  Occupational History  . Not on file  Tobacco Use  . Smoking status: Never Smoker  . Smokeless tobacco: Never Used  Substance and Sexual Activity  . Alcohol use: Yes    Comment: rare  . Drug use: No  . Sexual activity: Not on file  Other Topics Concern  . Not on file  Social History Narrative  . Not on file   Social Determinants of Health   Financial Resource Strain:   . Difficulty of Paying Living Expenses: Not on file  Food Insecurity:   . Worried About Charity fundraiser in the  Last Year: Not on file  . Ran Out of Food in the Last Year: Not on file  Transportation Needs:   . Lack of Transportation (Medical): Not on file  . Lack of Transportation (Non-Medical): Not on file  Physical Activity:   . Days of Exercise per Week: Not on file  . Minutes of Exercise per Session: Not on file  Stress:   . Feeling of Stress : Not on file  Social Connections:   . Frequency of Communication with Friends and Family: Not on file  . Frequency of Social Gatherings with Friends and Family: Not on file  . Attends Religious Services: Not on file  . Active Member of Clubs or Organizations: Not on file  . Attends Archivist Meetings: Not on file  . Marital Status: Not on file    Past Medical History:  Diagnosis Date  . Colitis, ulcerative chronic ([place])   . Hypertension      Patient Active Problem List   Diagnosis Date Noted  . Morbidly obese ([place]) 01/02/2018  . Lymphedema 07/04/2017  . 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 Class 3 severe obesity due to excess calories with serious comorbidity and body mass index (BMI) of 40.0 to 44.9 in adult ([place]) 10/16/2016  . Avitaminosis D 11/11/2015  . 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 Hypertension 08/25/2014    Past Surgical History:  Procedure Laterality Date  . CESAREAN  SECTION      Family History        Family Status  Relation Name Status  . Mother  Alive  . Father  Alive  . Sister  Alive  . Brother  Alive  . Brother  Alive  . Neg Hx  (Not Specified)        Her family history includes Diabetes in her father; Hypertension in her brother and father; Thyroid cancer in her sister. There is no history of Breast cancer.      Allergies  Allergen Reactions  . Lac Bovis   . Sulfa Antibiotics Nausea Only, Rash and Nausea And Vomiting     Current Outpatient Medications:  .  diphenhydrAMINE (BENADRYL) 25 mg capsule, Take 25 mg by mouth every 6 (six) hours as needed for  allergies (rash/hives)., Disp: , Rfl:  .  VITAMIN D PO, Take by mouth., Disp: , Rfl:  .  amLODipine (NORVASC) 10 MG tablet, Take 1 tablet (10 mg total) by mouth daily., Disp: 90 tablet, Rfl: 4 .  doxepin (SINEQUAN) 10 MG capsule, Take 1 capsule (10 mg total) by mouth 4 (four) times daily - after meals and at bedtime., Disp: 120 capsule, Rfl: 0 .  lisinopril-hydrochlorothiazide (PRINZIDE,ZESTORETIC) 10-12.5 MG tablet, TAKE 1 TABLET DAILY (Patient not taking: Reported on 01/21/2019), Disp: 90 tablet, Rfl: 4 .  phentermine (ADIPEX-P) 37.5 MG tablet, Take 1 tablet (37.5 mg total) by mouth daily before breakfast. (Patient not taking: Reported on 03/13/2019), Disp: 30 tablet, Rfl: 2   Patient Care Team: Rubye Beach as PCP - General (Family Medicine)    Objective:    Vitals: BP (!) 170/97   Pulse 84   Temp (!) 96.3 F (35.7 C) (Temporal)   Resp 18   Ht 5' 4"  (1.626 m)   Wt 246 lb 6.4 oz (111.8 kg)   LMP 03/01/2019   BMI 42.29 kg/m    Vitals:   03/13/19 1551  BP: (!) 170/97  Pulse: 84  Resp: 18  Temp: (!) 96.3 F (35.7 C)  TempSrc: Temporal  Weight: 246 lb 6.4 oz (111.8 kg)  Height: 5' 4"  (1.626 m)     Physical Exam Vitals reviewed.  Constitutional:      General: She is not in acute distress.    Appearance: Normal appearance. She is well-developed. She is obese. She is not ill-appearing or diaphoretic.  HENT:     Head: Normocephalic and atraumatic.     Right Ear: Hearing, tympanic membrane, ear canal and external ear normal.     Left Ear: Hearing, tympanic membrane, ear canal and external ear normal.     Mouth/Throat:     Pharynx: Uvula midline.  Eyes:     General: No scleral icterus.       Right eye: No discharge.        Left eye: No discharge.     Extraocular Movements: Extraocular movements intact.     Conjunctiva/sclera: Conjunctivae normal.     Pupils: Pupils are equal, round, and reactive to light.  Neck:     Thyroid: No thyromegaly.     Vascular:  No carotid bruit or JVD.     Trachea: No tracheal deviation.  Cardiovascular:     Rate and Rhythm: Normal rate and regular rhythm.     Pulses: Normal pulses.     Heart sounds: Normal heart sounds. No murmur. No friction rub. No gallop.   Pulmonary:     Effort: Pulmonary effort is normal. No respiratory  distress.     Breath sounds: Normal breath sounds. No wheezing or rales.  Chest:     Chest wall: No tenderness.     Breasts: Breasts are symmetrical.        Right: Normal. No inverted nipple, mass, nipple discharge, skin change or tenderness.        Left: Normal. No inverted nipple, mass, nipple discharge, skin change or tenderness.  Abdominal:     General: Abdomen is flat. Bowel sounds are normal. There is no distension.     Palpations: Abdomen is soft. There is no mass.     Tenderness: There is no abdominal tenderness. There is no guarding or rebound.     Hernia: There is no hernia in the left inguinal area.  Genitourinary:    Exam position: Supine.     Labia:        Right: No rash, tenderness, lesion or injury.        Left: No rash, tenderness, lesion or injury.      Vagina: Normal. No signs of injury. No vaginal discharge, erythema, tenderness or bleeding.     Cervix: No cervical motion tenderness, discharge or friability.     Adnexa:        Right: No mass, tenderness or fullness.         Left: No mass, tenderness or fullness.       Rectum: Normal.  Musculoskeletal:        General: No tenderness. Normal range of motion.     Cervical back: Normal range of motion and neck supple.     Right lower leg: No edema.     Left lower leg: No edema.  Lymphadenopathy:     Cervical: No cervical adenopathy.     Upper Body:     Right upper body: No supraclavicular, axillary or pectoral adenopathy.     Left upper body: No supraclavicular, axillary or pectoral adenopathy.  Skin:    General: Skin is warm and dry.     Capillary Refill: Capillary refill takes less than 2 seconds.      Findings: No rash.  Neurological:     General: No focal deficit present.     Mental Status: She is alert and oriented to person, place, and time. Mental status is at baseline.     Cranial Nerves: No cranial nerve deficit.     Coordination: Coordination normal.     Deep Tendon Reflexes: Reflexes are normal and symmetric.  Psychiatric:        Mood and Affect: Mood normal.        Behavior: Behavior normal.        Thought Content: Thought content normal.        Judgment: Judgment normal.      Depression Screen PHQ 2/9 Scores 03/13/2019 01/02/2018 12/21/2016 04/27/2016  PHQ - 2 Score 0 1 0 0  PHQ- 9 Score 2 - 1 1       Assessment & Plan:     Routine Health Maintenance and Physical Exam  Exercise Activities and Dietary recommendations Goals   None     Immunization History  Administered Date(s) Administered  . Influenza Split 02/10/2010, 02/13/2011, 04/05/2012  . Influenza,inj,Quad PF,6+ Mos 11/11/2015, 12/21/2016, 01/02/2018  . Td 01/02/2018  . Tdap 04/30/2006    Health Maintenance  Topic Date Due  . INFLUENZA VACCINE  05/28/2019 (Originally 09/28/2018)  . PAP SMEAR-Modifier  01/02/2021  . TETANUS/TDAP  01/03/2028  . HIV Screening  Discontinued  Discussed health benefits of physical activity, and encouraged her to engage in regular exercise appropriate for her age and condition.    1. Annual physical exam Normal physical exam today. Will check labs as below and f/u pending lab results. If labs are stable and WNL she will not need to have these rechecked for one year at her next annual physical exam. She is to call the office in the meantime if she has any acute issue, questions or concerns.  2. Cervical cancer screening Pap collected today. Will send as below and f/u pending results. - Cytology - PAP  3. Essential hypertension Restart Amlodipine as below. Will restart lisinopril-HCTZ next week if BP remains over 140/90. Will check labs as below and f/u pending  results. - amLODipine (NORVASC) 10 MG tablet; Take 1 tablet (10 mg total) by mouth daily.  Dispense: 90 tablet; Refill: 4 - CBC w/Diff/Platelet - Comprehensive Metabolic Panel (CMET) - TSH - Lipid Profile - HgB A1c  4. Urticaria Still present now almost 3 months. Uses Benadryl. Does resolve with benadryl. Will try doxepin as below. Will check labs as below and f/u pending results. - doxepin (SINEQUAN) 10 MG capsule; Take 1 capsule (10 mg total) by mouth 4 (four) times daily - after meals and at bedtime.  Dispense: 120 capsule; Refill: 0 - Comprehensive Metabolic Panel (CMET) - TSH  5. Class 3 severe obesity due to excess calories with serious comorbidity and body mass index (BMI) of 40.0 to 44.9 in adult [facility]) Counseled patient on healthy lifestyle modifications including dieting and exercise.  - CBC w/Diff/Platelet - Comprehensive Metabolic Panel (CMET) - TSH - Lipid Profile - HgB A1c  6. Avitaminosis D H/O this. Will check labs as below and f/u pending results. - CBC w/Diff/Platelet  7. Chemical diabetes Will check labs as below and f/u pending results. - CBC w/Diff/Platelet - Comprehensive Metabolic Panel (CMET) - Lipid Profile - HgB A1c  --------------------------------------------------------------------    Mar Daring, PA-C  [facility]

## 2019-03-14 DIAGNOSIS — L509 Urticaria, unspecified: Secondary | ICD-10-CM | POA: Diagnosis not present

## 2019-03-14 DIAGNOSIS — R7303 Prediabetes: Secondary | ICD-10-CM | POA: Diagnosis not present

## 2019-03-14 DIAGNOSIS — I10 Essential (primary) hypertension: Secondary | ICD-10-CM | POA: Diagnosis not present

## 2019-03-14 DIAGNOSIS — Z6841 Body Mass Index (BMI) 40.0 and over, adult: Secondary | ICD-10-CM | POA: Diagnosis not present

## 2019-03-15 LAB — COMPREHENSIVE METABOLIC PANEL
ALT: 10 IU/L (ref 0–32)
AST: 10 IU/L (ref 0–40)
Albumin/Globulin Ratio: 1.9 (ref 1.2–2.2)
Albumin: 4 g/dL (ref 3.8–4.8)
Alkaline Phosphatase: 67 IU/L (ref 39–117)
BUN/Creatinine Ratio: 16 (ref 9–23)
BUN: 13 mg/dL (ref 6–24)
Bilirubin Total: 0.2 mg/dL (ref 0.0–1.2)
CO2: 23 mmol/L (ref 20–29)
Calcium: 8.9 mg/dL (ref 8.7–10.2)
Chloride: 101 mmol/L (ref 96–106)
Creatinine, Ser: 0.79 mg/dL (ref 0.57–1.00)
GFR calc Af Amer: 104 mL/min/{1.73_m2} (ref >59)
GFR calc non Af Amer: 90 mL/min/{1.73_m2} (ref >59)
Globulin, Total: 2.1 g/dL (ref 1.5–4.5)
Glucose: 108 mg/dL — ABNORMAL HIGH (ref 65–99)
Potassium: 3.7 mmol/L (ref 3.5–5.2)
Sodium: 140 mmol/L (ref 134–144)
Total Protein: 6.1 g/dL (ref 6.0–8.5)

## 2019-03-15 LAB — LIPID PANEL
Chol/HDL Ratio: 3.5 ratio (ref 0.0–4.4)
Cholesterol, Total: 184 mg/dL (ref 100–199)
HDL: 52 mg/dL (ref >39)
LDL Chol Calc (NIH): 106 mg/dL — ABNORMAL HIGH (ref 0–99)
Triglycerides: 151 mg/dL — ABNORMAL HIGH (ref 0–149)
VLDL Cholesterol Cal: 26 mg/dL (ref 5–40)

## 2019-03-15 LAB — CBC WITH DIFFERENTIAL/PLATELET
Basophils Absolute: 0 10*3/uL (ref 0.0–0.2)
Basos: 0 %
EOS (ABSOLUTE): 0 10*3/uL (ref 0.0–0.4)
Eos: 0 %
Hematocrit: 38.8 % (ref 34.0–46.6)
Hemoglobin: 12.7 g/dL (ref 11.1–15.9)
Immature Grans (Abs): 0 10*3/uL (ref 0.0–0.1)
Immature Granulocytes: 0 %
Lymphocytes Absolute: 3 10*3/uL (ref 0.7–3.1)
Lymphs: 33 %
MCH: 26.5 pg — ABNORMAL LOW (ref 26.6–33.0)
MCHC: 32.7 g/dL (ref 31.5–35.7)
MCV: 81 fL (ref 79–97)
Monocytes Absolute: 0.5 10*3/uL (ref 0.1–0.9)
Monocytes: 6 %
Neutrophils Absolute: 5.5 10*3/uL (ref 1.4–7.0)
Neutrophils: 61 %
Platelets: 352 10*3/uL (ref 150–450)
RBC: 4.8 x10E6/uL (ref 3.77–5.28)
RDW: 14.3 % (ref 11.7–15.4)
WBC: 9.1 10*3/uL (ref 3.4–10.8)

## 2019-03-15 LAB — HEMOGLOBIN A1C
Est. average glucose Bld gHb Est-mCnc: 126 mg/dL
Hgb A1c MFr Bld: 6 % — ABNORMAL HIGH (ref 4.8–5.6)

## 2019-03-15 LAB — TSH: TSH: 2.84 u[IU]/mL (ref 0.450–4.500)

## 2019-03-17 ENCOUNTER — Telehealth: Payer: Self-pay

## 2019-03-17 LAB — CYTOLOGY - PAP
Comment: NEGATIVE
Diagnosis: NEGATIVE
High risk HPV: NEGATIVE

## 2019-03-17 NOTE — Telephone Encounter (Signed)
Pt given results per Joycelyn Man; she was also given result of PAP smear, " Pap is normal, HPV negative. Will repeat in 1 year. This will hopefully give [place] 3 years consecutive of normals and can return to normal screenings"; the pt verbalized understanding; unable to chart in result note because encounter not created; will route to office for notification,

## 2019-03-17 NOTE — Telephone Encounter (Signed)
Tried calling; pt's voicemail is not set up.  Okay for PEC to give results below.    Thanks,   -Mickel Baas

## 2019-03-17 NOTE — Telephone Encounter (Signed)
Tried calling; pt's voicemail box is not set up yet.  Okay for PEC to give the results below.    Thanks,   -Mickel Baas

## 2019-03-17 NOTE — Telephone Encounter (Signed)
-----   Message from Mar Daring, [place] sent at 03/17/2019 11:49 AM EST ----- Pap is normal, HPV negative.  Will repeat in 1 year. This will hopefully give [place] 3 years consecutive of normals and can return to normal screenings.

## 2019-03-17 NOTE — Telephone Encounter (Signed)
-----   Message from Mar Daring, [place] sent at 03/17/2019  7:22 AM EST ----- Blood count is normal. Kidney and liver function are normal. Sodium, potassium and calcium are normal. Thyroid is normal. Cholesterol up slightly compared to last year but doing ok. A1c improved just slightly from 6.1 to 6.0.

## 2019-08-04 ENCOUNTER — Other Ambulatory Visit: Payer: Self-pay | Admitting: Physician Assistant

## 2019-08-04 DIAGNOSIS — Z6841 Body Mass Index (BMI) 40.0 and over, adult: Secondary | ICD-10-CM

## 2019-08-04 MED ORDER — PHENTERMINE HCL 37.5 MG PO TABS: 37.5000 mg | ORAL_TABLET | Freq: Every day | ORAL | 2 refills | Status: DC

## 2019-08-04 NOTE — Telephone Encounter (Signed)
Pt request refill  doxepin (SINEQUAN) 10 MG capsule phentermine (ADIPEX-P) 37.5 MG tablet  Jamie Fuentes, [place] Phone:  [phone]  Fax:  [phone]

## 2020-01-09 ENCOUNTER — Ambulatory Visit: Payer: BC Managed Care – PPO | Admitting: Physician Assistant

## 2020-01-09 ENCOUNTER — Encounter: Payer: Self-pay | Admitting: Physician Assistant

## 2020-01-09 ENCOUNTER — Other Ambulatory Visit: Payer: Self-pay

## 2020-01-09 VITALS — BP 160/87 | HR 93 | Temp 99.2°F | Resp 16 | Wt 246.7 lb

## 2020-01-09 DIAGNOSIS — L918 Other hypertrophic disorders of the skin: Secondary | ICD-10-CM | POA: Diagnosis not present

## 2020-01-09 DIAGNOSIS — I10 Essential (primary) hypertension: Secondary | ICD-10-CM

## 2020-01-09 DIAGNOSIS — Z6841 Body Mass Index (BMI) 40.0 and over, adult: Secondary | ICD-10-CM | POA: Diagnosis not present

## 2020-01-09 DIAGNOSIS — L509 Urticaria, unspecified: Secondary | ICD-10-CM

## 2020-01-09 DIAGNOSIS — Z23 Encounter for immunization: Secondary | ICD-10-CM | POA: Diagnosis not present

## 2020-01-09 MED ORDER — DOXEPIN HCL 10 MG PO CAPS: 10.0000 mg | ORAL_CAPSULE | Freq: Two times a day (BID) | ORAL | 1 refills | Status: DC | PRN

## 2020-01-09 MED ORDER — DOXEPIN HCL 10 MG PO CAPS: 10.0000 mg | ORAL_CAPSULE | Freq: Two times a day (BID) | ORAL | 3 refills | Status: DC | PRN

## 2020-01-09 MED ORDER — PHENTERMINE HCL 37.5 MG PO TABS: 37.5000 mg | ORAL_TABLET | Freq: Every day | ORAL | 2 refills | Status: DC

## 2020-01-09 MED ORDER — LISINOPRIL-HYDROCHLOROTHIAZIDE 10-12.5 MG PO TABS: 1.0000 | ORAL_TABLET | Freq: Every day | ORAL | 1 refills | Status: DC

## 2020-01-09 MED ORDER — LISINOPRIL-HYDROCHLOROTHIAZIDE 10-12.5 MG PO TABS: 1.0000 | ORAL_TABLET | Freq: Every day | ORAL | 4 refills | Status: DC

## 2020-01-09 NOTE — Progress Notes (Signed)
Established patient visit   [name]   DOB: May 19, 1970   49 y.o. Female  MRN: [id number] Visit Date: 01/09/2020  Today's healthcare provider: Mar Daring, PA-C   Chief Complaint  Patient presents with  . skin tags   Subjective    HPI  Patient coming in to have several skin tags removed.  Patient reports that she would like a refill on the Doxepin, Lisinopril-HCTZ and Phentermine.  Hypertension: Being taking Amlodipine only but not the Lisinopril-HCTZ. Reports that her BP at [facility] was really high one of this day. Denies chest pain, headache, palpitations or blurred vision.  Obesity: She reports that she walks every evening for minute to an hour. She is eating twice a day. She has cut back on the meal portions. Reports that in the morning she does smoothies.   Wt Readings from Last 3 Encounters:  01/09/20 246 lb 11.2 oz (111.9 kg)  03/13/19 246 lb 6.4 oz (111.8 kg)  01/21/19 241 lb (109.3 kg)    Patient Active Problem List   Diagnosis Date Noted  . Morbidly obese ([place]) 01/02/2018  . Lymphedema 07/04/2017  . 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 Class 3 severe obesity due to excess calories with serious comorbidity and body mass index (BMI) of 40.0 to 44.9 in adult ([place]) 10/16/2016  . Avitaminosis D 11/11/2015  . 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 Hypertension 08/25/2014   Past Medical History:  Diagnosis Date  . Colitis, ulcerative chronic ([place])   . Hypertension        Medications: Outpatient Medications Prior to Visit  Medication Sig  . amLODipine (NORVASC) 10 MG tablet Take 1 tablet (10 mg total) by mouth daily.  Marland Kitchen VITAMIN D PO Take by mouth.  . diphenhydrAMINE (BENADRYL) 25 mg capsule Take 25 mg by mouth every 6 (six) hours as needed for allergies (rash/hives). (Patient not taking: Reported on 01/09/2020)  . doxepin (SINEQUAN) 10 MG capsule Take 1 capsule (10 mg total) by mouth 4 (four) times  daily - after meals and at bedtime. (Patient not taking: Reported on 01/09/2020)  . lisinopril-hydrochlorothiazide (PRINZIDE,ZESTORETIC) 10-12.5 MG tablet TAKE 1 TABLET DAILY (Patient not taking: Reported on 01/21/2019)  . phentermine (ADIPEX-P) 37.5 MG tablet Take 1 tablet (37.5 mg total) by mouth daily before breakfast. (Patient not taking: Reported on 01/09/2020)   No facility-administered medications prior to visit.    Review of Systems  Constitutional: Negative.   Respiratory: Negative.   Cardiovascular: Negative.   Neurological: Negative.     Last CBC Lab Results  Component Value Date   WBC 9.1 03/14/2019   HGB 12.7 03/14/2019   HCT 38.8 03/14/2019   MCV 81 03/14/2019   MCH 26.5 (L) 03/14/2019   RDW 14.3 03/14/2019   PLT 352 75/64/3329   Last metabolic panel Lab Results  Component Value Date   GLUCOSE 108 (H) 03/14/2019   NA 140 03/14/2019   K 3.7 03/14/2019   CL 101 03/14/2019   CO2 23 03/14/2019   BUN 13 03/14/2019   CREATININE 0.79 03/14/2019   GFRNONAA 90 03/14/2019   GFRAA 104 03/14/2019   CALCIUM 8.9 03/14/2019   PROT 6.1 03/14/2019   ALBUMIN 4.0 03/14/2019   LABGLOB 2.1 03/14/2019   AGRATIO 1.9 03/14/2019   BILITOT 0.2 03/14/2019   ALKPHOS 67 03/14/2019   AST 10 03/14/2019   ALT 10 03/14/2019      Objective    BP (!) 160/87 (BP Location: Left  Arm, Patient Position: Sitting, Cuff Size: Large)   Pulse 93   Temp 99.2 F (37.3 C) (Oral)   Resp 16   Wt 246 lb 11.2 oz (111.9 kg)   BMI 42.35 kg/m  BP Readings from Last 3 Encounters:  01/09/20 (!) 160/87  03/13/19 (!) 170/97  01/21/19 139/89   Wt Readings from Last 3 Encounters:  01/09/20 246 lb 11.2 oz (111.9 kg)  03/13/19 246 lb 6.4 oz (111.8 kg)  01/21/19 241 lb (109.3 kg)      Physical Exam Vitals reviewed.  Constitutional:      General: She is not in acute distress.    Appearance: Normal appearance. She is well-developed. She is obese. She is not ill-appearing or diaphoretic.  Neck:      Thyroid: No thyromegaly.     Vascular: No carotid bruit or JVD.     Trachea: No tracheal deviation.  Cardiovascular:     Rate and Rhythm: Normal rate and regular rhythm.     Pulses: Normal pulses.     Heart sounds: Normal heart sounds. No murmur heard.  No friction rub. No gallop.   Pulmonary:     Effort: Pulmonary effort is normal. No respiratory distress.     Breath sounds: Normal breath sounds. No wheezing or rales.  Musculoskeletal:     Cervical back: Normal range of motion and neck supple. No tenderness.  Lymphadenopathy:     Cervical: No cervical adenopathy.  Skin:    Capillary Refill: Capillary refill takes less than 2 seconds.     Comments: Skin tags around neck and left underarm  Neurological:     General: No focal deficit present.     Mental Status: She is alert. Mental status is at baseline.      No results found for any visits on 01/09/20.  Assessment & Plan     1. Skin tag 8 skin tags cut off and 3 removed with cryopen. Tolerated well.   2. BMI 40.0-44.9, adult ([place]) Counseled patient on healthy lifestyle modifications including dieting and exercise.  Restart phentermine as below. F/U in 3 months for weight recheck.  - phentermine (ADIPEX-P) 37.5 MG tablet; Take 1 tablet (37.5 mg total) by mouth daily before breakfast.  Dispense: 30 tablet; Refill: 2  3. Morbidly obese ([place]) See above medical treatment plan. - phentermine (ADIPEX-P) 37.5 MG tablet; Take 1 tablet (37.5 mg total) by mouth daily before breakfast.  Dispense: 30 tablet; Refill: 2  4. Essential hypertension Elevated today. Advised to continue amlodipine 78m. Restart lisinopril-HCTZ as below. F/U in 1-3 months.  - lisinopril-hydrochlorothiazide (ZESTORETIC) 10-12.5 MG tablet; Take 1 tablet by mouth daily.  Dispense: 90 tablet; Refill: 4  5. Urticaria Stable. Diagnosis pulled for medication refill. Continue current medical treatment plan. - doxepin (SINEQUAN) 10 MG capsule; Take 1 capsule (10  mg total) by mouth 2 (two) times daily as needed.  Dispense: 180 capsule; Refill: 3  6. Need for influenza vaccination Flu vaccine given today without complication. Patient sat upright for 15 minutes to check for adverse reaction before being released. - Flu Vaccine QUAD 36+ mos IM   No follow-ups on file.      IReynolds Bowl PA-C, have reviewed all documentation for this visit. The documentation on 01/13/20 for the exam, diagnosis, procedures, and orders are all accurate and complete.   JRubye Beach BFort [facility]oudoun Medical [phone](phone) [phone](fax)  [place]

## 2020-01-13 ENCOUNTER — Encounter: Payer: Self-pay | Admitting: Physician Assistant

## 2020-02-13 ENCOUNTER — Encounter: Payer: Self-pay | Admitting: Physician Assistant

## 2020-02-13 MED ORDER — SILVER SULFADIAZINE 1 % EX CREA: 1.0000 "application " | TOPICAL_CREAM | Freq: Two times a day (BID) | CUTANEOUS | 0 refills | Status: DC
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
## 2020-02-23 DIAGNOSIS — J069 Acute upper respiratory infection, unspecified: Secondary | ICD-10-CM | POA: Diagnosis not present

## 2020-02-23 DIAGNOSIS — J020 Streptococcal pharyngitis: Secondary | ICD-10-CM | POA: Diagnosis not present

## 2020-02-23 DIAGNOSIS — R059 Cough, unspecified: Secondary | ICD-10-CM | POA: Diagnosis not present

## 2020-03-12 ENCOUNTER — Other Ambulatory Visit: Payer: Self-pay | Admitting: Physician Assistant

## 2020-03-12 NOTE — Telephone Encounter (Signed)
Requested medication (s) are due for refill today - unsure  Requested medication (s) are on the active medication list -yes  Future visit scheduled -no  Last refill: 02/14/20  Notes to clinic: Request RF medication not assigned protocol  Requested Prescriptions  Pending Prescriptions Disp Refills   SSD 1 % cream [Pharmacy Med Name: SSD 1 % External Cream] 50 g 0    Sig: APPLY TOPICALLY TWICE DAILY      Off-Protocol Failed - 03/12/2020  9:07 AM      Failed - Medication not assigned to a protocol, review manually.      Passed - Valid encounter within last 12 months    Recent Outpatient Visits           2 months ago Skin tag   [place], Clearnce Sorrel, [place]   1 year ago Annual physical exam   [place], Clearnce Sorrel, [place]   1 year ago Essential hypertension   [place], Clearnce Sorrel, [place]   1 year ago Bilateral impacted cerumen   [place], Anderson [place] M, [place]   2 years ago BMI 40.0-44.9, adult [facility])   [facility], [place], [place]                    Requested Prescriptions  Pending Prescriptions Disp Refills   SSD 1 % cream [Pharmacy Med Name: SSD 1 % External Cream] 50 g 0    Sig: APPLY TOPICALLY TWICE DAILY      Off-Protocol Failed - 03/12/2020  9:07 AM      Failed - Medication not assigned to a protocol, review manually.      Passed - Valid encounter within last 12 months    Recent Outpatient Visits           2 months ago Skin tag   [place], Clearnce Sorrel, [place]   1 year ago Annual physical exam   [place], Clearnce Sorrel, [place]   1 year ago Essential hypertension   [place], Clearnce Sorrel, [place]   1 year ago Bilateral impacted cerumen   [place], [place], [place]   2 years ago BMI 40.0-44.9, adult [facility])   [place], [place], [place]

## 2020-04-13 ENCOUNTER — Other Ambulatory Visit: Payer: Self-pay | Admitting: Physician Assistant

## 2020-04-13 DIAGNOSIS — I10 Essential (primary) hypertension: Secondary | ICD-10-CM

## 2020-04-22 ENCOUNTER — Other Ambulatory Visit: Payer: Self-pay

## 2020-04-22 ENCOUNTER — Encounter: Payer: Self-pay | Admitting: Physician Assistant

## 2020-04-22 ENCOUNTER — Ambulatory Visit (INDEPENDENT_AMBULATORY_CARE_PROVIDER_SITE_OTHER): Payer: BC Managed Care – PPO | Admitting: Physician Assistant

## 2020-04-22 VITALS — BP 175/114 | HR 71 | Temp 97.2°F | Resp 18 | Ht 64.0 in | Wt 245.0 lb

## 2020-04-22 DIAGNOSIS — Z1159 Encounter for screening for other viral diseases: Secondary | ICD-10-CM | POA: Diagnosis not present

## 2020-04-22 DIAGNOSIS — E559 Vitamin D deficiency, unspecified: Secondary | ICD-10-CM

## 2020-04-22 DIAGNOSIS — Z1239 Encounter for other screening for malignant neoplasm of breast: Secondary | ICD-10-CM | POA: Diagnosis not present

## 2020-04-22 DIAGNOSIS — Z0000 Encounter for general adult medical examination without abnormal findings: Secondary | ICD-10-CM

## 2020-04-22 DIAGNOSIS — I10 Essential (primary) hypertension: Secondary | ICD-10-CM

## 2020-04-22 DIAGNOSIS — R7303 Prediabetes: Secondary | ICD-10-CM

## 2020-04-22 DIAGNOSIS — Z6841 Body Mass Index (BMI) 40.0 and over, adult: Secondary | ICD-10-CM

## 2020-04-22 MED ORDER — LISINOPRIL-HYDROCHLOROTHIAZIDE 10-12.5 MG PO TABS: 1.0000 | ORAL_TABLET | Freq: Every day | ORAL | 3 refills | Status: DC

## 2020-04-22 MED ORDER — PHENTERMINE HCL 37.5 MG PO TABS
37.5000 mg | ORAL_TABLET | Freq: Every day | ORAL | 0 refills | Status: DC
Start: 2020-04-22 — End: 2020-04-28

## 2020-04-22 MED ORDER — AMLODIPINE BESYLATE 10 MG PO TABS
10.0000 mg | ORAL_TABLET | Freq: Every day | ORAL | 3 refills | Status: DC
Start: 2020-04-22 — End: 2021-08-11

## 2020-04-22 NOTE — Patient Instructions (Signed)
[facility] [address] [place],  [place]  [address] Main: [phone]   Preventive Care 40-48 Years Old, Female Preventive care refers to lifestyle choices and visits with your health care provider that can promote health and wellness. This includes:  A yearly physical exam. This is also called an annual wellness visit.  Regular dental and eye exams.  Immunizations.  Screening for certain conditions.  Healthy lifestyle choices, such as: ? Eating a healthy diet. ? Getting regular exercise. ? Not using drugs or products that contain nicotine and tobacco. ? Limiting alcohol use. What can I expect for my preventive care visit? Physical exam Your health care provider will check your:  Height and weight. These may be used to calculate your BMI (body mass index). BMI is a measurement that tells if you are at a healthy weight.  Heart rate and blood pressure.  Body temperature.  Skin for abnormal spots. Counseling Your health care provider may ask you questions about your:  Past medical problems.  Family's medical history.  Alcohol, tobacco, and drug use.  Emotional well-being.  Home life and relationship well-being.  Sexual activity.  Diet, exercise, and sleep habits.  Work and work environment.  Access to firearms.  Method of birth control.  Menstrual cycle.  Pregnancy history. What immunizations do I need? Vaccines are usually given at various ages, according to a schedule. Your health care provider will recommend vaccines for you based on your age, medical history, and lifestyle or other factors, such as travel or where you work.   What tests do I need? Blood tests  Lipid and cholesterol levels. These may be checked every 5 years, or more often if you are over 50 years old.  Hepatitis C test.  Hepatitis B test. Screening  Lung cancer screening. You may have this screening every year starting at age 55 if you  have a 30-pack-year history of smoking and currently smoke or have quit within the past 15 years.  Colorectal cancer screening. ? All adults should have this screening starting at age 50 and continuing until age 75. ? Your health care provider may recommend screening at age 45 if you are at increased risk. ? You will have tests every 1-10 years, depending on your results and the type of screening test.  Diabetes screening. ? This is done by checking your blood sugar (glucose) after you have not eaten for a while (fasting). ? You may have this done every 1-3 years.  Mammogram. ? This may be done every 1-2 years. ? Talk with your health care provider about when you should start having regular mammograms. This may depend on whether you have a family history of breast cancer.  BRCA-related cancer screening. This may be done if you have a family history of breast, ovarian, tubal, or peritoneal cancers.  Pelvic exam and Pap test. ? This may be done every 3 years starting at age 21. ? Starting at age 30, this may be done every 5 years if you have a Pap test in combination with an HPV test. Other tests  STD (sexually transmitted disease) testing, if you are at risk.  Bone density scan. This is done to screen for osteoporosis. You may have this scan if you are at high risk for osteoporosis. Talk with your health care provider about your test results, treatment options, and if necessary, the need for more tests. Follow these instructions at home: Eating and drinking  Eat a diet   that includes fresh fruits and vegetables, whole grains, lean protein, and low-fat dairy products.  Take vitamin and mineral supplements as recommended by your health care provider.  Do not drink alcohol if: ? Your health care provider tells you not to drink. ? You are pregnant, may be pregnant, or are planning to become pregnant.  If you drink alcohol: ? Limit how much you have to 0-1 drink a day. ? Be aware of  how much alcohol is in your drink. In the [place] one drink equals one 12 oz bottle of beer (355 mL), one 5 oz glass of wine (148 mL), or one 1 oz glass of hard liquor (44 mL).   Lifestyle  Take daily care of your teeth and gums. Brush your teeth every morning and night with fluoride toothpaste. Floss one time each day.  Stay active. Exercise for at least 30 minutes 5 or more days each week.  Do not use any products that contain nicotine or tobacco, such as cigarettes, e-cigarettes, and chewing tobacco. If you need help quitting, ask your health care provider.  Do not use drugs.  If you are sexually active, practice safe sex. Use a condom or other form of protection to prevent STIs (sexually transmitted infections).  If you do not wish to become pregnant, use a form of birth control. If you plan to become pregnant, see your health care provider for a prepregnancy visit.  If told by your health care provider, take low-dose aspirin daily starting at age 50.  Find healthy ways to cope with stress, such as: ? Meditation, yoga, or listening to music. ? Journaling. ? Talking to a trusted person. ? Spending time with friends and family. Safety  Always wear your seat belt while driving or riding in a vehicle.  Do not drive: ? If you have been drinking alcohol. Do not ride with someone who has been drinking. ? When you are tired or distracted. ? While texting.  Wear a helmet and other protective equipment during sports activities.  If you have firearms in your house, make sure you follow all gun safety procedures. What's next?  Visit your health care provider once a year for an annual wellness visit.  Ask your health care provider how often you should have your eyes and teeth checked.  Stay up to date on all vaccines. This information is not intended to replace advice given to you by your health care provider. Make sure you discuss any questions you have with your health care  provider. Document Revised: 11/18/2019 Document Reviewed: 10/25/2017 Elsevier Patient Education  2021 [employer]  

## 2020-04-22 NOTE — Progress Notes (Signed)
Complete physical exam   [name]   DOB: 1970-10-27   50 y.o. Female  MRN: [id number] Visit Date: 04/22/2020  Today's healthcare provider: Mar Daring, PA-C   Chief Complaint  Patient presents with  . Annual Exam   Subjective    Jamie Fuentes is a 50 y.o. female who presents today for a complete physical exam.  She reports consuming a general diet. Gym/ health club routine includes cardio and light weights. She generally feels fairly well. She reports sleeping poorly. She does not have additional problems to discuss today.  HPI    Past Medical History:  Diagnosis Date  . Colitis, ulcerative chronic ([place])   . Hypertension    Past Surgical History:  Procedure Laterality Date  . CESAREAN SECTION     Social History   Socioeconomic History  . Marital status: Married    Spouse name: Not on file  . Number of children: Not on file  . Years of education: Not on file  . Highest education level: Not on file  Occupational History  . Not on file  Tobacco Use  . Smoking status: Never Smoker  . Smokeless tobacco: Never Used  Substance and Sexual Activity  . Alcohol use: Yes    Comment: rare  . Drug use: No  . Sexual activity: Not on file  Other Topics Concern  . Not on file  Social History Narrative  . Not on file   Social Determinants of Health   Financial Resource Strain: Not on file  Food Insecurity: Not on file  Transportation Needs: Not on file  Physical Activity: Not on file  Stress: Not on file  Social Connections: Not on file  Intimate Partner Violence: Not on file   Family Status  Relation Name Status  . Mother  Alive  . Father  Alive  . Sister  Alive  . Brother  Alive  . Brother  Alive  . Neg Hx  (Not Specified)   Family History  Problem Relation Age of Onset  . Diabetes Father   . Hypertension Father   . Thyroid cancer Sister   . Hypertension Brother   . Breast cancer Neg Hx    Allergies  Allergen Reactions   . Lac Bovis   . Sulfa Antibiotics Nausea Only, Rash and Nausea And Vomiting    Patient Care Team: Rubye Beach as PCP - General (Family Medicine)   Medications: Outpatient Medications Prior to Visit  Medication Sig  . amLODipine (NORVASC) 10 MG tablet Take 1 tablet by mouth once daily  . diphenhydrAMINE (BENADRYL) 25 mg capsule Take 25 mg by mouth every 6 (six) hours as needed for allergies (rash/hives).  Marland Kitchen doxepin (SINEQUAN) 10 MG capsule Take 1 capsule (10 mg total) by mouth 2 (two) times daily as needed.  Marland Kitchen lisinopril-hydrochlorothiazide (ZESTORETIC) 10-12.5 MG tablet Take 1 tablet by mouth daily.  . phentermine (ADIPEX-P) 37.5 MG tablet Take 1 tablet (37.5 mg total) by mouth daily before breakfast.  . SSD 1 % cream APPLY TOPICALLY TWICE DAILY  . VITAMIN D PO Take by mouth.   No facility-administered medications prior to visit.    Review of Systems  Constitutional: Negative for chills, fatigue and fever.  HENT: Negative for congestion, ear pain, rhinorrhea, sneezing and sore throat.   Eyes: Negative.  Negative for pain and redness.  Respiratory: Negative for cough, shortness of breath and wheezing.   Cardiovascular: Negative for chest pain and leg swelling.  Gastrointestinal:  Negative for abdominal pain, blood in stool, constipation, diarrhea and nausea.  Endocrine: Negative for polydipsia and polyphagia.  Genitourinary: Negative.  Negative for dysuria, flank pain, hematuria, pelvic pain, vaginal bleeding and vaginal discharge.  Musculoskeletal: Negative for arthralgias, back pain, gait problem and joint swelling.  Skin: Negative for rash.  Neurological: Negative.  Negative for dizziness, tremors, seizures, weakness, light-headedness, numbness and headaches.  Hematological: Negative for adenopathy.  Psychiatric/Behavioral: Negative.  Negative for behavioral problems, confusion and dysphoric mood. The patient is not nervous/anxious and is not hyperactive.      Last CBC Lab Results  Component Value Date   WBC 9.1 03/14/2019   HGB 12.7 03/14/2019   HCT 38.8 03/14/2019   MCV 81 03/14/2019   MCH 26.5 (L) 03/14/2019   RDW 14.3 03/14/2019   PLT 352 81/44/8185   Last metabolic panel Lab Results  Component Value Date   GLUCOSE 108 (H) 03/14/2019   NA 140 03/14/2019   K 3.7 03/14/2019   CL 101 03/14/2019   CO2 23 03/14/2019   BUN 13 03/14/2019   CREATININE 0.79 03/14/2019   GFRNONAA 90 03/14/2019   GFRAA 104 03/14/2019   CALCIUM 8.9 03/14/2019   PROT 6.1 03/14/2019   ALBUMIN 4.0 03/14/2019   LABGLOB 2.1 03/14/2019   AGRATIO 1.9 03/14/2019   BILITOT 0.2 03/14/2019   ALKPHOS 67 03/14/2019   AST 10 03/14/2019   ALT 10 03/14/2019      Objective    BP (!) 164/104 (BP Location: Right Arm, Patient Position: Sitting, Cuff Size: Large)   Temp (!) 97.2 F (36.2 C) (Temporal)   Resp 18   Ht 5' 4"  (1.626 m)   Wt 245 lb (111.1 kg)   LMP 04/12/2020   BMI 42.05 kg/m  BP Readings from Last 3 Encounters:  04/22/20 (!) 175/114  01/09/20 (!) 160/87  03/13/19 (!) 170/97   Wt Readings from Last 3 Encounters:  04/22/20 245 lb (111.1 kg)  01/09/20 246 lb 11.2 oz (111.9 kg)  03/13/19 246 lb 6.4 oz (111.8 kg)      Physical Exam Vitals reviewed.  Constitutional:      General: She is not in acute distress.    Appearance: Normal appearance. She is well-developed and well-nourished. She is obese. She is not ill-appearing or diaphoretic.  HENT:     Head: Normocephalic and atraumatic.     Right Ear: Tympanic membrane, ear canal and external ear normal.     Left Ear: Tympanic membrane, ear canal and external ear normal.     Mouth/Throat:     Mouth: Oropharynx is clear and moist.  Eyes:     General: No scleral icterus.       Right eye: No discharge.        Left eye: No discharge.     Extraocular Movements: Extraocular movements intact and EOM normal.     Conjunctiva/sclera: Conjunctivae normal.     Pupils: Pupils are equal, round, and  reactive to light.  Neck:     Thyroid: No thyromegaly.     Vascular: No carotid bruit or JVD.     Trachea: No tracheal deviation.  Cardiovascular:     Rate and Rhythm: Normal rate and regular rhythm.     Pulses: Normal pulses and intact distal pulses.     Heart sounds: Normal heart sounds. No murmur heard. No friction rub. No gallop.   Pulmonary:     Effort: Pulmonary effort is normal. No respiratory distress.     Breath sounds:  Normal breath sounds. No wheezing or rales.  Chest:     Chest wall: No tenderness.  Abdominal:     General: Abdomen is flat. Bowel sounds are normal. There is no distension.     Palpations: Abdomen is soft. There is no mass.     Tenderness: There is no abdominal tenderness. There is no guarding or rebound.  Musculoskeletal:        General: No tenderness or edema. Normal range of motion.     Cervical back: Normal range of motion and neck supple. No tenderness.     Right lower leg: No edema.     Left lower leg: No edema.  Lymphadenopathy:     Cervical: No cervical adenopathy.  Skin:    General: Skin is warm and dry.     Capillary Refill: Capillary refill takes less than 2 seconds.     Findings: No rash.  Neurological:     General: No focal deficit present.     Mental Status: She is alert and oriented to person, place, and time. Mental status is at baseline.  Psychiatric:        Mood and Affect: Mood and affect and mood normal.        Behavior: Behavior normal.        Thought Content: Thought content normal.        Judgment: Judgment normal.     Last depression screening scores PHQ 2/9 Scores 01/09/2020 03/13/2019 01/02/2018  PHQ - 2 Score 0 0 1  PHQ- 9 Score - 2 -   Last fall risk screening Fall Risk  01/09/2020  Falls in the past year? 0  Number falls in past yr: 0  Injury with Fall? 0  Risk for fall due to : No Fall Risks  Follow up Falls evaluation completed   Last Audit-C alcohol use screening Alcohol Use Disorder Test (AUDIT) 01/09/2020   1. How often do you have a drink containing alcohol? 2  2. How many drinks containing alcohol do you have on a typical day when you are drinking? 0  3. How often do you have six or more drinks on one occasion? 0  AUDIT-C Score 2  Alcohol Brief Interventions/Follow-up AUDIT Score <7 follow-up not indicated   A score of 3 or more in women, and 4 or more in men indicates increased risk for alcohol abuse, EXCEPT if all of the points are from question 1   No results found for any visits on 04/22/20.  Assessment & Plan    Routine Health Maintenance and Physical Exam  Exercise Activities and Dietary recommendations Goals   None     Immunization History  Administered Date(s) Administered  . Influenza Split 02/10/2010, 02/13/2011, 04/05/2012  . Influenza,inj,Quad PF,6+ Mos 11/11/2015, 12/21/2016, 01/02/2018, 01/09/2020  . Td 01/02/2018  . Tdap 04/30/2006    Health Maintenance  Topic Date Due  . Hepatitis C Screening  Never done  . COVID-19 Vaccine (1) Never done  . PAP SMEAR-Modifier  03/12/2022  . COLONOSCOPY (Pts 45-35yr Insurance coverage will need to be confirmed)  05/27/2024  . TETANUS/TDAP  01/03/2028  . INFLUENZA VACCINE  Completed  . HIV Screening  Discontinued    Discussed health benefits of physical activity, and encouraged her to engage in regular exercise appropriate for her age and condition.  1. Annual physical exam Normal physical exam today. Will check labs as below and f/u pending lab results. If labs are stable and WNL she will not need to have these  rechecked for one year at her next annual physical exam. She is to call the office in the meantime if she has any acute issue, questions or concerns. - CBC w/Diff/Platelet - Comprehensive Metabolic Panel (CMET) - Lipid Panel With LDL/HDL Ratio - HgB A1c - TSH  2. Encounter for breast cancer screening using non-mammogram modality There is no family history of breast cancer. She does perform regular self breast  exams. Mammogram was ordered as below. Information for [facility] Breast clinic was given to patient so she may schedule her mammogram at her convenience. - MM 3D SCREEN BREAST BILATERAL; Future  3. Class 3 severe obesity due to excess calories with serious comorbidity and body mass index (BMI) of 40.0 to 44.9 in adult [facility]) Counseled patient on healthy lifestyle modifications including dieting and exercise.  Trying phentermine again to help with weight loss.  - Comprehensive Metabolic Panel (CMET) - Lipid Panel With LDL/HDL Ratio - HgB A1c - TSH  4. Avitaminosis D History of this. Will check labs as below and f/u pending results. - Vitamin D (25 hydroxy)  5. Prediabetes Diet controlled. Will check labs as below and f/u pending results.  - Comprehensive Metabolic Panel (CMET) - Lipid Panel With LDL/HDL Ratio - HgB A1c  6. Essential hypertension Elevated. Patient has been out of amlodipine. Diagnosis pulled for medication refill. Continue current medical treatment plan. - Comprehensive Metabolic Panel (CMET) - Lipid Panel With LDL/HDL Ratio - HgB A1c - TSH - amLODipine (NORVASC) 10 MG tablet; Take 1 tablet (10 mg total) by mouth daily.  Dispense: 90 tablet; Refill: 3 - lisinopril-hydrochlorothiazide (ZESTORETIC) 10-12.5 MG tablet; Take 1 tablet by mouth daily.  Dispense: 90 tablet; Refill: 3  7. Morbidly obese ([place]) See above medical treatment plan for #3. - Lipid Panel With LDL/HDL Ratio - HgB A1c - TSH - phentermine (ADIPEX-P) 37.5 MG tablet; Take 1 tablet (37.5 mg total) by mouth daily before breakfast.  Dispense: 90 tablet; Refill: 0  8. Need for hepatitis C screening test Will check labs as below and f/u pending results. - Hepatitis C Antibody   No follow-ups on file.     Reynolds Bowl, PA-C, have reviewed all documentation for this visit. The documentation on 04/22/20 for the exam, diagnosis, procedures, and orders are all accurate and complete.   Rubye Beach  [facility] [address] [phone] (phone) [phone] (fax)  [place]

## 2020-04-23 LAB — VITAMIN D 25 HYDROXY (VIT D DEFICIENCY, FRACTURES): Vit D, 25-Hydroxy: 27.3 ng/mL — ABNORMAL LOW (ref 30.0–100.0)

## 2020-04-23 LAB — CBC WITH DIFFERENTIAL/PLATELET
Basophils Absolute: 0 10*3/uL (ref 0.0–0.2)
Basos: 0 %
EOS (ABSOLUTE): 0.1 10*3/uL (ref 0.0–0.4)
Eos: 2 %
Hematocrit: 41.5 % (ref 34.0–46.6)
Hemoglobin: 13.8 g/dL (ref 11.1–15.9)
Immature Grans (Abs): 0 10*3/uL (ref 0.0–0.1)
Immature Granulocytes: 0 %
Lymphocytes Absolute: 3 10*3/uL (ref 0.7–3.1)
Lymphs: 34 %
MCH: 29.6 pg (ref 26.6–33.0)
MCHC: 33.3 g/dL (ref 31.5–35.7)
MCV: 89 fL (ref 79–97)
Monocytes Absolute: 0.5 10*3/uL (ref 0.1–0.9)
Monocytes: 6 %
Neutrophils Absolute: 5.2 10*3/uL (ref 1.4–7.0)
Neutrophils: 58 %
Platelets: 378 10*3/uL (ref 150–450)
RBC: 4.66 x10E6/uL (ref 3.77–5.28)
RDW: 12.7 % (ref 11.7–15.4)
WBC: 8.8 10*3/uL (ref 3.4–10.8)

## 2020-04-23 LAB — COMPREHENSIVE METABOLIC PANEL
ALT: 26 IU/L (ref 0–32)
AST: 20 IU/L (ref 0–40)
Albumin/Globulin Ratio: 1.6 (ref 1.2–2.2)
Albumin: 4.1 g/dL (ref 3.8–4.8)
Alkaline Phosphatase: 67 IU/L (ref 44–121)
BUN/Creatinine Ratio: 10 (ref 9–23)
BUN: 7 mg/dL (ref 6–24)
Bilirubin Total: 0.4 mg/dL (ref 0.0–1.2)
CO2: 21 mmol/L (ref 20–29)
Calcium: 9.2 mg/dL (ref 8.7–10.2)
Chloride: 102 mmol/L (ref 96–106)
Creatinine, Ser: 0.72 mg/dL (ref 0.57–1.00)
GFR calc Af Amer: 115 mL/min/{1.73_m2} (ref >59)
GFR calc non Af Amer: 100 mL/min/{1.73_m2} (ref >59)
Globulin, Total: 2.5 g/dL (ref 1.5–4.5)
Glucose: 120 mg/dL — ABNORMAL HIGH (ref 65–99)
Potassium: 3.6 mmol/L (ref 3.5–5.2)
Sodium: 139 mmol/L (ref 134–144)
Total Protein: 6.6 g/dL (ref 6.0–8.5)

## 2020-04-23 LAB — LIPID PANEL WITH LDL/HDL RATIO
Cholesterol, Total: 159 mg/dL (ref 100–199)
HDL: 51 mg/dL (ref >39)
LDL Chol Calc (NIH): 89 mg/dL (ref 0–99)
LDL/HDL Ratio: 1.7 ratio (ref 0.0–3.2)
Triglycerides: 106 mg/dL (ref 0–149)
VLDL Cholesterol Cal: 19 mg/dL (ref 5–40)

## 2020-04-23 LAB — HEMOGLOBIN A1C
Est. average glucose Bld gHb Est-mCnc: 111 mg/dL
Hgb A1c MFr Bld: 5.5 % (ref 4.8–5.6)

## 2020-04-23 LAB — TSH: TSH: 1.47 u[IU]/mL (ref 0.450–4.500)

## 2020-04-23 LAB — HEPATITIS C ANTIBODY: Hep C Virus Ab: <0.1 s/co ratio (ref 0.0–0.9)

## 2020-04-28 ENCOUNTER — Other Ambulatory Visit: Payer: Self-pay | Admitting: Physician Assistant

## 2020-04-28 DIAGNOSIS — Z6841 Body Mass Index (BMI) 40.0 and over, adult: Secondary | ICD-10-CM

## 2020-04-28 MED ORDER — PHENTERMINE HCL 37.5 MG PO TABS: 37.5000 mg | ORAL_TABLET | Freq: Every day | ORAL | 0 refills | Status: DC

## 2020-05-20 ENCOUNTER — Ambulatory Visit
Admission: RE | Admit: 2020-05-20 | Discharge: 2020-05-20 | Disposition: A | Discharge status: Home / Self Care | Payer: BC Managed Care – PPO | Source: Ambulatory Visit | Attending: Physician Assistant | Admitting: Physician Assistant

## 2020-05-20 ENCOUNTER — Other Ambulatory Visit: Payer: Self-pay

## 2020-05-20 DIAGNOSIS — Z1231 Encounter for screening mammogram for malignant neoplasm of breast: Secondary | ICD-10-CM | POA: Diagnosis not present

## 2020-05-20 DIAGNOSIS — R928 Other abnormal and inconclusive findings on diagnostic imaging of breast: Secondary | ICD-10-CM | POA: Diagnosis not present

## 2020-05-20 DIAGNOSIS — Z1239 Encounter for other screening for malignant neoplasm of breast: Secondary | ICD-10-CM

## 2020-05-24 ENCOUNTER — Other Ambulatory Visit: Payer: Self-pay | Admitting: Physician Assistant

## 2020-05-24 DIAGNOSIS — N6489 Other specified disorders of breast: Secondary | ICD-10-CM

## 2020-05-24 DIAGNOSIS — R928 Other abnormal and inconclusive findings on diagnostic imaging of breast: Secondary | ICD-10-CM

## 2020-05-27 ENCOUNTER — Ambulatory Visit: Admission: RE | Admit: 2020-05-27 | Payer: BC Managed Care – PPO | Source: Ambulatory Visit

## 2020-05-27 ENCOUNTER — Ambulatory Visit
Admission: RE | Admit: 2020-05-27 | Discharge: 2020-05-27 | Disposition: A | Discharge status: Home / Self Care | Payer: BC Managed Care – PPO | Source: Ambulatory Visit | Attending: Physician Assistant | Admitting: Physician Assistant

## 2020-05-27 ENCOUNTER — Other Ambulatory Visit: Payer: Self-pay

## 2020-05-27 DIAGNOSIS — N6489 Other specified disorders of breast: Secondary | ICD-10-CM | POA: Diagnosis present

## 2020-05-27 DIAGNOSIS — R928 Other abnormal and inconclusive findings on diagnostic imaging of breast: Secondary | ICD-10-CM | POA: Diagnosis present

## 2020-05-28 ENCOUNTER — Telehealth: Payer: Self-pay

## 2020-05-28 NOTE — Telephone Encounter (Signed)
-----   Message from Mar Daring, [place] sent at 05/27/2020  3:40 PM EDT ----- Normal mammogram. Repeat screening in one year.

## 2020-05-28 NOTE — Telephone Encounter (Signed)
Patient was advised and reports she received results while at [facility] as well.

## 2020-08-24 ENCOUNTER — Ambulatory Visit: Payer: Self-pay | Admitting: *Deleted

## 2020-08-24 NOTE — Telephone Encounter (Signed)
Patient is calling to report that she thinks she may have a UTI- she states her urine has strong odor and is dark- she states that happened last time she had UTI. Patient states she is leaving town in couple hours and wants to know if she can drop off specimen. Call to office- advised patient needs to go to [facility] - they can not send specimen without visit. Patient advised.

## 2020-08-24 NOTE — Telephone Encounter (Signed)
Summary: Please advise   Pt is having UTI symptoms starting a couple of ago. Pt is going out of town today. Pt is having strong dark urine. No openings at [facility]      Reason for Disposition . Bad or foul-smelling urine  Answer Assessment - Initial Assessment Questions 1. SYMPTOM: "What's the main symptom you're concerned about?" (e.g., frequency, incontinence)     Strong odor, dark in color 2. ONSET: "When did the  odor  start?"     2 days 3. PAIN: "Is there any pain?" If Yes, ask: "How bad is it?" (Scale: 1-10; mild, moderate, severe)     No pain 4. CAUSE: "What do you think is causing the symptoms?"     Possible UTI 5. OTHER SYMPTOMS: "Do you have any other symptoms?" (e.g., fever, flank pain, blood in urine, pain with urination)     No other symptoms 6. PREGNANCY: "Is there any chance you are pregnant?" "When was your last menstrual period?"     No- vasectomy  Protocols used: Urinary Symptoms-A-AH

## 2020-08-25 NOTE — Telephone Encounter (Signed)
Agree with plan to send to [facility].

## 2020-11-07 ENCOUNTER — Other Ambulatory Visit: Payer: Self-pay | Admitting: Physician Assistant

## 2020-11-07 DIAGNOSIS — Z6841 Body Mass Index (BMI) 40.0 and over, adult: Secondary | ICD-10-CM

## 2020-11-08 ENCOUNTER — Encounter: Payer: Self-pay | Admitting: Physician Assistant

## 2020-11-08 ENCOUNTER — Other Ambulatory Visit: Payer: Self-pay | Admitting: Physician Assistant

## 2020-11-08 DIAGNOSIS — Z6841 Body Mass Index (BMI) 40.0 and over, adult: Secondary | ICD-10-CM

## 2021-04-22 ENCOUNTER — Other Ambulatory Visit: Payer: Self-pay | Admitting: Physician Assistant

## 2021-04-22 ENCOUNTER — Other Ambulatory Visit: Payer: Self-pay

## 2021-04-22 DIAGNOSIS — L509 Urticaria, unspecified: Secondary | ICD-10-CM

## 2021-04-22 NOTE — Telephone Encounter (Signed)
Copied from CRM [id number]. Topic: Quick Communication - Rx Refill/Question >> Apr 22, 2021 11:55 AM Gaetana Michaelis A wrote: Medication: doxepin (SINEQUAN) 10 MG capsule [[id number]]   Has the patient contacted their pharmacy? No. Patient was uncertain of the medication's status  (Agent: If no, request that the patient contact the pharmacy for the refill. If patient does not wish to contact the pharmacy document the reason why and proceed with request.) (Agent: If yes, when and what did the pharmacy advise?)  Preferred Pharmacy (with phone number or street name): [facility] - [place], [place] - [address] [place] [address] Phone: [phone] Fax: [phone]   Has the patient been seen for an appointment in the last year OR does the patient have an upcoming appointment? Yes.    Agent: Please be advised that RX refills may take up to 3 business days. We ask that you follow-up with your pharmacy.

## 2021-04-22 NOTE — Telephone Encounter (Signed)
Requested medications are due for refill today.  yes  Requested medications are on the active medications list.  yes  Last refill. 01/09/2020 #180 3 refills  Future visit scheduled.   yes  Notes to clinic.  Joycelyn Man listed as PCP Pt last seen 04/22/2020. Protocol states both 6 month and 12 month refill window. Please review.    Requested Prescriptions  Pending Prescriptions Disp Refills   doxepin (SINEQUAN) 10 MG capsule 180 capsule 3    Sig: Take 1 capsule (10 mg total) by mouth 2 (two) times daily as needed.     Psychiatry:  Antidepressants - Heterocyclics (TCAs) Failed - 04/22/2021 12:51 PM      Failed - Valid encounter within last 6 months    Recent Outpatient Visits           1 year ago Annual physical exam   [facility] [place], [place], [place]   1 year ago Skin tag   [facility] [place], [place], [place]   2 years ago Annual physical exam   [facility] [place], [place], [place]   2 years ago Essential hypertension   [facility] [place], [place], [place]   2 years ago Bilateral impacted cerumen   [facility] [place], [place], [place]       Future Appointments             In 1 week Alfredia Ferguson, PA-C [facility], [facility]           Psychiatry:  Antidepressants - Heterocyclics (TCAs) - doxepin Failed - 04/22/2021 12:51 PM      Failed - Valid encounter within last 12 months    Recent Outpatient Visits           1 year ago Annual physical exam   [facility] [place], [place], [place]   1 year ago Skin tag   [facility] [place], [place], [place]   2 years ago Annual physical exam   [facility] [place], [place], [place]   2 years ago Essential hypertension   [facility] [place], [place], [place]   2 years ago Bilateral impacted cerumen   [facility] [place], [place], [place]        Future Appointments             In 1 week Alfredia Ferguson, PA-C [facility]

## 2021-04-25 MED ORDER — DOXEPIN HCL 10 MG PO CAPS: 10.0000 mg | ORAL_CAPSULE | Freq: Two times a day (BID) | ORAL | 0 refills | Status: DC | PRN

## 2021-05-03 ENCOUNTER — Encounter: Payer: BC Managed Care – PPO | Admitting: Physician Assistant

## 2021-08-11 ENCOUNTER — Encounter: Payer: Self-pay | Admitting: Physician Assistant

## 2021-08-11 ENCOUNTER — Ambulatory Visit (INDEPENDENT_AMBULATORY_CARE_PROVIDER_SITE_OTHER): Payer: BC Managed Care – PPO | Admitting: Physician Assistant

## 2021-08-11 ENCOUNTER — Other Ambulatory Visit (HOSPITAL_COMMUNITY)
Admission: RE | Admit: 2021-08-11 | Discharge: 2021-08-11 | Disposition: A | Discharge status: Home / Self Care | Payer: BC Managed Care – PPO | Source: Ambulatory Visit | Attending: Physician Assistant | Admitting: Physician Assistant

## 2021-08-11 VITALS — BP 137/81 | HR 95 | Ht 64.5 in | Wt 281.8 lb

## 2021-08-11 DIAGNOSIS — E559 Vitamin D deficiency, unspecified: Secondary | ICD-10-CM | POA: Diagnosis not present

## 2021-08-11 DIAGNOSIS — Z124 Encounter for screening for malignant neoplasm of cervix: Secondary | ICD-10-CM | POA: Diagnosis present

## 2021-08-11 DIAGNOSIS — Z0000 Encounter for general adult medical examination without abnormal findings: Secondary | ICD-10-CM | POA: Diagnosis not present

## 2021-08-11 DIAGNOSIS — Z113 Encounter for screening for infections with a predominantly sexual mode of transmission: Secondary | ICD-10-CM | POA: Insufficient documentation

## 2021-08-11 DIAGNOSIS — Z1231 Encounter for screening mammogram for malignant neoplasm of breast: Secondary | ICD-10-CM

## 2021-08-11 DIAGNOSIS — I10 Essential (primary) hypertension: Secondary | ICD-10-CM | POA: Diagnosis not present

## 2021-08-11 DIAGNOSIS — R7303 Prediabetes: Secondary | ICD-10-CM | POA: Diagnosis not present

## 2021-08-11 DIAGNOSIS — E782 Mixed hyperlipidemia: Secondary | ICD-10-CM

## 2021-08-11 MED ORDER — AMLODIPINE BESYLATE 10 MG PO TABS: 10.0000 mg | ORAL_TABLET | Freq: Every day | ORAL | 3 refills | Status: DC

## 2021-08-11 MED ORDER — LISINOPRIL-HYDROCHLOROTHIAZIDE 20-12.5 MG PO TABS: 1.0000 | ORAL_TABLET | Freq: Every day | ORAL | 3 refills | Status: DC

## 2021-08-11 NOTE — Assessment & Plan Note (Signed)
Historically, will check a1c

## 2021-08-11 NOTE — Assessment & Plan Note (Signed)
Elevated in office and still elevated when checked outside office. Advised increasing lisinopril hctz to 20 mg / 12.5 mg. Sent in higher dose.  Will check cmp

## 2021-08-11 NOTE — Progress Notes (Signed)
I,Sha'taria Tyson,acting as a Education administrator for [employer], PA-C.,have documented all relevant documentation on the behalf of Mikey Kirschner, PA-C,as directed by  Mikey Kirschner, PA-C while in the presence of Mikey Kirschner, PA-C.  Complete physical exam   [name]   DOB: 1970/08/16   51 y.o. Female  MRN: [id number] Visit Date: 08/11/2021  Today's healthcare provider: Mikey Kirschner, PA-C   Cc. cpe  Subjective    Jamie Fuentes is a 51 y.o. female who presents today for a complete physical exam.  She reports consuming a general diet. Gym/ health club routine includes light weights and treadmill. She generally feels well. She reports sleeping fairly well. She does have additional problems to discuss today.  HPI  She has had abnormal pap smear in the past and wants one done today.  Weight management -Previously taken phentermine, was able to lose weight but stopped medication and gained weight back. Questions about re-starting. She has previously tried weight watchers without success.  Hypertension -Does not check at home, but it is checked every time she donates blood -- last was 137/81. Pt reports she is consistent w/ medication.   Past Medical History:  Diagnosis Date   Colitis, ulcerative chronic (HCC)    Hypertension    Past Surgical History:  Procedure Laterality Date   CESAREAN SECTION     Social History   Socioeconomic History   Marital status: Married    Spouse name: Not on file   Number of children: Not on file   Years of education: Not on file   Highest education level: Not on file  Occupational History   Not on file  Tobacco Use   Smoking status: Never   Smokeless tobacco: Never  Substance and Sexual Activity   Alcohol use: Yes    Comment: rare   Drug use: No   Sexual activity: Not on file  Other Topics Concern   Not on file  Social History Narrative   Not on file   Social Determinants of Health   Financial Resource Strain: Not  on file  Food Insecurity: Not on file  Transportation Needs: Not on file  Physical Activity: Not on file  Stress: Not on file  Social Connections: Not on file  Intimate Partner Violence: Not on file   Family Status  Relation Name Status   Mother  Alive   Father  Alive   Sister  Alive   Brother  Alive   Brother  Alive   Neg Hx  (Not Specified)   Family History  Problem Relation Age of Onset   Diabetes Father    Hypertension Father    Thyroid cancer Sister    Hypertension Brother    Breast cancer Neg Hx    Allergies  Allergen Reactions   Lac Bovis    Sulfa Antibiotics Nausea Only, Rash and Nausea And Vomiting    Patient Care Team: Mikey Kirschner, PA-C as PCP - General (Physician Assistant)   Medications: Outpatient Medications Prior to Visit  Medication Sig   diphenhydrAMINE (BENADRYL) 25 mg capsule Take 25 mg by mouth every 6 (six) hours as needed for allergies (rash/hives).   doxepin (SINEQUAN) 10 MG capsule Take 1 capsule (10 mg total) by mouth 2 (two) times daily as needed.   phentermine (ADIPEX-P) 37.5 MG tablet Take 1 tablet (37.5 mg total) by mouth daily before breakfast.   VITAMIN D PO Take by mouth.   [DISCONTINUED] amLODipine (NORVASC) 10 MG tablet Take 1 tablet (10 mg total)  by mouth daily.   [DISCONTINUED] lisinopril-hydrochlorothiazide (ZESTORETIC) 10-12.5 MG tablet Take 1 tablet by mouth daily.   SSD 1 % cream APPLY TOPICALLY TWICE DAILY (Patient not taking: Reported on 08/11/2021)   No facility-administered medications prior to visit.    Review of Systems  Constitutional:  Positive for unexpected weight change.  Cardiovascular:  Positive for leg swelling.  Gastrointestinal:  Positive for abdominal distention.  Psychiatric/Behavioral:  The patient is nervous/anxious.     Objective     Blood pressure 137/81, pulse 95, height 5' 4.5" (1.638 m), weight 281 lb 12.8 oz (127.8 kg), last menstrual period 07/30/2021, SpO2 99 %.    Physical  Exam Constitutional:      General: She is awake.     Appearance: She is well-developed.  HENT:     Head: Normocephalic.  Eyes:     Conjunctiva/sclera: Conjunctivae normal.  Cardiovascular:     Rate and Rhythm: Normal rate and regular rhythm.     Heart sounds: Normal heart sounds.  Pulmonary:     Effort: Pulmonary effort is normal.     Breath sounds: Normal breath sounds.  Genitourinary:    Labia:        Right: No rash or lesion.        Left: No rash or lesion.      Vagina: Vaginal discharge present.     Cervix: Normal.     Uterus: Normal.      Adnexa: Right adnexa normal.  Skin:    General: Skin is warm.  Neurological:     Mental Status: She is alert and oriented to person, place, and time.  Psychiatric:        Attention and Perception: Attention normal.        Mood and Affect: Mood normal.        Speech: Speech normal.        Behavior: Behavior is cooperative.     Last depression screening scores    08/11/2021    8:48 AM 04/22/2020    8:31 AM 01/09/2020    5:09 PM  PHQ 2/9 Scores  PHQ - 2 Score 0 0 0  PHQ- 9 Score 2 1    Last fall risk screening    08/11/2021    8:48 AM  Fall Risk   Falls in the past year? 0  Number falls in past yr: 0  Injury with Fall? 0  Risk for fall due to : No Fall Risks   Last Audit-C alcohol use screening    08/11/2021    8:46 AM  Alcohol Use Disorder Test (AUDIT)  1. How often do you have a drink containing alcohol? 1  2. How many drinks containing alcohol do you have on a typical day when you are drinking? 0  3. How often do you have six or more drinks on one occasion? 0  AUDIT-C Score 1   A score of 3 or more in women, and 4 or more in men indicates increased risk for alcohol abuse, EXCEPT if all of the points are from question 1   No results found for any visits on 08/11/21.  Assessment & Plan    Routine Health Maintenance and Physical Exam  Exercise Activities and Dietary recommendations --balanced diet high in fiber  and protein, low in sugars, carbs, fats. --physical activity/exercise 30 minutes 3-5 times a week     Immunization History  Administered Date(s) Administered   Influenza Split 02/10/2010, 02/13/2011, 04/05/2012   Influenza,inj,Quad PF,6+ Mos 11/11/2015,  12/21/2016, 01/02/2018, 01/09/2020   Td 01/02/2018   Tdap 04/30/2006    Health Maintenance  Topic Date Due   MAMMOGRAM  05/20/2021   COVID-19 Vaccine (1) 08/27/2021 (Originally 11/11/1970)   INFLUENZA VACCINE  09/27/2021   PAP SMEAR-Modifier  03/12/2022   COLONOSCOPY (Pts 45-61yr Insurance coverage will need to be confirmed)  05/27/2024   TETANUS/TDAP  01/03/2028   Hepatitis C Screening  Completed   HPV VACCINES  Aged Out   HIV Screening  Discontinued    Discussed health benefits of physical activity, and encouraged her to engage in regular exercise appropriate for her age and condition.  Problem List Items Addressed This Visit       Cardiovascular and Mediastinum   Hypertension    Elevated in office and still elevated when checked outside office. Advised increasing lisinopril hctz to 20 mg / 12.5 mg. Sent in higher dose.  Will check cmp        Relevant Medications   amLODipine (NORVASC) 10 MG tablet   lisinopril-hydrochlorothiazide (ZESTORETIC) 20-12.5 MG tablet   Other Relevant Orders   Comprehensive metabolic panel   CBC     Other   Avitaminosis D    Historically, pt supplements w/ vit d, will recheck      Relevant Orders   Vitamin D (25 hydroxy)   Prediabetes    Historically, will check a1c      Relevant Orders   HgB A1c   CBC   Morbidly obese (HCC)    Discussed weight loss -- diet, exercise at length. Discussed weight loss medications; not currently a candidate for phenetermine d/t uncontrolled blood pressure.  Would like to refer to nutritionist -- pt declined. Discussed GLP-1 meds; pt interested in wegovy. Advised her insurance may not cover as she has not been consistent w/ a diet plan in the  past. We will attempt a PA. Advised of MOA, side effects. Advised of supply issues.  Once starting wegovy -- f/u 3 mo      Relevant Orders   TSH + free T4   Moderate mixed hyperlipidemia not requiring statin therapy    Historically will check fasting labs The 10-year ASCVD risk score (Arnett DK, et al., 2019) is: 1.4%       Relevant Medications   amLODipine (NORVASC) 10 MG tablet   lisinopril-hydrochlorothiazide (ZESTORETIC) 20-12.5 MG tablet   Other Relevant Orders   Lipid panel   Comprehensive metabolic panel   Other Visit Diagnoses     Encounter for annual health examination    -  Primary   Breast cancer screening by mammogram       Relevant Orders   MM 3D SCREEN BREAST BILATERAL   Cervical cancer screening       Relevant Orders   Cytology - PAP      Advised pt she is not due for pap this year-- last was 2021 and WNL , HPV negative. Pt requests a repeat.    Return in about 3 months (around 11/11/2021) for weight Management.     I, LMikey Kirschner PA-C have reviewed all documentation for this visit. The documentation on  08/11/2021  for the exam, diagnosis, procedures, and orders are all accurate and complete.  LMikey Kirschner PA-C [facility] [place] [address]Office: [phone]: 3[place]

## 2021-08-11 NOTE — Assessment & Plan Note (Signed)
Historically will check fasting labs The 10-year ASCVD risk score (Arnett DK, et al., 2019) is: 1.4%

## 2021-08-11 NOTE — Assessment & Plan Note (Signed)
Historically, pt supplements w/ vit d, will recheck

## 2021-08-11 NOTE — Assessment & Plan Note (Signed)
Discussed weight loss -- diet, exercise at length. Discussed weight loss medications; not currently a candidate for phenetermine d/t uncontrolled blood pressure.  Would like to refer to nutritionist -- pt declined. Discussed GLP-1 meds; pt interested in wegovy. Advised her insurance may not cover as she has not been consistent w/ a diet plan in the past. We will attempt a PA. Advised of MOA, side effects. Advised of supply issues.  Once starting wegovy -- f/u 3 mo

## 2021-08-11 NOTE — Patient Instructions (Signed)
Please contact [phone] to schedule your mammogram. They will ask which location you prefer to be seen in. You have two options listed below.  1) [facility] located at [place], [place] [address] 2) [facility] located at [place], [place] [address]  Please feel free to contact us if you have any further questions or concerns

## 2021-08-12 LAB — CBC
Hematocrit: 37.6 % (ref 34.0–46.6)
Hemoglobin: 12.6 g/dL (ref 11.1–15.9)
MCH: 30.7 pg (ref 26.6–33.0)
MCHC: 33.5 g/dL (ref 31.5–35.7)
MCV: 92 fL (ref 79–97)
Platelets: 306 10*3/uL (ref 150–450)
RBC: 4.11 x10E6/uL (ref 3.77–5.28)
RDW: 13 % (ref 11.7–15.4)
WBC: 9.2 10*3/uL (ref 3.4–10.8)

## 2021-08-12 LAB — COMPREHENSIVE METABOLIC PANEL
ALT: 34 IU/L — ABNORMAL HIGH (ref 0–32)
AST: 25 IU/L (ref 0–40)
Albumin/Globulin Ratio: 1.6 (ref 1.2–2.2)
Albumin: 4 g/dL (ref 3.8–4.8)
Alkaline Phosphatase: 57 IU/L (ref 44–121)
BUN/Creatinine Ratio: 18 (ref 9–23)
BUN: 15 mg/dL (ref 6–24)
Bilirubin Total: 0.3 mg/dL (ref 0.0–1.2)
CO2: 21 mmol/L (ref 20–29)
Calcium: 9.1 mg/dL (ref 8.7–10.2)
Chloride: 102 mmol/L (ref 96–106)
Creatinine, Ser: 0.84 mg/dL (ref 0.57–1.00)
Globulin, Total: 2.5 g/dL (ref 1.5–4.5)
Glucose: 143 mg/dL — ABNORMAL HIGH (ref 70–99)
Potassium: 3.9 mmol/L (ref 3.5–5.2)
Sodium: 141 mmol/L (ref 134–144)
Total Protein: 6.5 g/dL (ref 6.0–8.5)
eGFR: 85 mL/min/{1.73_m2} (ref >59)

## 2021-08-12 LAB — TSH+FREE T4
Free T4: 1 ng/dL (ref 0.82–1.77)
TSH: 3.3 u[IU]/mL (ref 0.450–4.500)

## 2021-08-12 LAB — HEMOGLOBIN A1C
Est. average glucose Bld gHb Est-mCnc: 134 mg/dL
Hgb A1c MFr Bld: 6.3 % — ABNORMAL HIGH (ref 4.8–5.6)

## 2021-08-12 LAB — LIPID PANEL
Chol/HDL Ratio: 4 ratio (ref 0.0–4.4)
Cholesterol, Total: 186 mg/dL (ref 100–199)
HDL: 47 mg/dL (ref >39)
LDL Chol Calc (NIH): 110 mg/dL — ABNORMAL HIGH (ref 0–99)
Triglycerides: 168 mg/dL — ABNORMAL HIGH (ref 0–149)
VLDL Cholesterol Cal: 29 mg/dL (ref 5–40)

## 2021-08-12 LAB — VITAMIN D 25 HYDROXY (VIT D DEFICIENCY, FRACTURES): Vit D, 25-Hydroxy: 31.9 ng/mL (ref 30.0–100.0)

## 2021-08-15 ENCOUNTER — Encounter: Payer: Self-pay | Admitting: Physician Assistant

## 2021-08-15 LAB — CYTOLOGY - PAP
Adequacy: ABSENT
Chlamydia: NEGATIVE
Comment: NEGATIVE
Comment: NEGATIVE
Comment: NEGATIVE
Comment: NORMAL
Diagnosis: NEGATIVE
High risk HPV: NEGATIVE
Neisseria Gonorrhea: NEGATIVE
Trichomonas: NEGATIVE

## 2021-08-16 ENCOUNTER — Other Ambulatory Visit: Payer: Self-pay | Admitting: Physician Assistant

## 2021-08-16 ENCOUNTER — Encounter: Payer: Self-pay | Admitting: Physician Assistant

## 2021-08-16 DIAGNOSIS — R7303 Prediabetes: Secondary | ICD-10-CM

## 2021-08-16 MED ORDER — WEGOVY 0.25 MG/0.5ML ~~LOC~~ SOAJ: 0.2500 mg | SUBCUTANEOUS | 0 refills | Status: DC

## 2021-08-16 NOTE — Progress Notes (Signed)
Rx wegovy

## 2021-09-01 ENCOUNTER — Other Ambulatory Visit: Payer: Self-pay | Admitting: Physician Assistant

## 2021-09-01 DIAGNOSIS — R7303 Prediabetes: Secondary | ICD-10-CM

## 2021-09-01 MED ORDER — WEGOVY 0.25 MG/0.5ML ~~LOC~~ SOAJ: 0.2500 mg | SUBCUTANEOUS | 0 refills | Status: DC

## 2021-09-09 ENCOUNTER — Encounter: Payer: Self-pay | Admitting: Physician Assistant

## 2021-09-09 ENCOUNTER — Ambulatory Visit
Admission: RE | Admit: 2021-09-09 | Discharge: 2021-09-09 | Disposition: A | Discharge status: Home / Self Care | Payer: BC Managed Care – PPO | Source: Ambulatory Visit | Attending: Physician Assistant | Admitting: Physician Assistant

## 2021-09-09 DIAGNOSIS — Z1231 Encounter for screening mammogram for malignant neoplasm of breast: Secondary | ICD-10-CM | POA: Insufficient documentation

## 2021-10-05 ENCOUNTER — Ambulatory Visit: Payer: BC Managed Care – PPO | Admitting: Physician Assistant

## 2021-10-11 ENCOUNTER — Telehealth: Payer: Self-pay

## 2021-10-11 NOTE — Telephone Encounter (Signed)
Pt called back, she says that she never got the [employer] denying as well as not covering the phentermine that was sent in March. Pt is asking if any update on PA appeal or can send in phentermine rx to [facility], she can see how much it will cost with [facility] discount. Please advise

## 2021-10-20 NOTE — Progress Notes (Signed)
I,Rosa Gambale Robinson,acting as a Education administrator for [employer], PA-C.,have documented all relevant documentation on the behalf of Mardene Speak, PA-C,as directed by  [employer], PA-C while in the presence of [employer], PA-C.   Established patient visit   [name]   DOB: 12/05/1970   51 y.o. Female  MRN: [id number] Visit Date: 10/21/2021  Today's healthcare provider: Mardene Speak, PA-C   Chief Complaint  Patient presents with   Ear Pain   Subjective    Patient presents for ear pain/right x 5 days and feels clogged.  Reports it feels like she's in a tunnel.  Taking nothing for pain. Accumulated within 18-20 mo  Per Mendel Ryder pap results from 08/11/21 states pap did pick up overgrowth of yeast and possible BV. Pt wants to retest due to vaginal odor x 2 mo.     Medications: Outpatient Medications Prior to Visit  Medication Sig   amLODipine (NORVASC) 10 MG tablet Take 1 tablet (10 mg total) by mouth daily.   diphenhydrAMINE (BENADRYL) 25 mg capsule Take 25 mg by mouth every 6 (six) hours as needed for allergies (rash/hives).   doxepin (SINEQUAN) 10 MG capsule Take 1 capsule (10 mg total) by mouth 2 (two) times daily as needed.   lisinopril-hydrochlorothiazide (ZESTORETIC) 20-12.5 MG tablet Take 1 tablet by mouth daily.   SSD 1 % cream APPLY TOPICALLY TWICE DAILY   VITAMIN D PO Take by mouth.   phentermine (ADIPEX-P) 37.5 MG tablet Take 1 tablet (37.5 mg total) by mouth daily before breakfast. (Patient not taking: Reported on 10/21/2021)   Semaglutide-Weight Management (WEGOVY) 0.25 MG/0.5ML SOAJ Inject 0.25 mg into the skin once a week. (Patient not taking: Reported on 10/21/2021)   No facility-administered medications prior to visit.    Review of Systems  All other systems reviewed and are negative. Except see HPI     Objective    BP 131/75 (BP Location: Left Arm, Patient Position: Sitting, Cuff Size: Large)   Pulse 85   Temp 98.6 F (37 C) (Oral)   Resp 16    Wt 279 lb 14.4 oz (127 kg)   SpO2 95%   BMI 47.30 kg/m    Physical Exam Vitals reviewed.  Constitutional:      General: She is not in acute distress.    Appearance: Normal appearance. She is well-developed. She is not diaphoretic.  HENT:     Head: Normocephalic and atraumatic.     Right Ear: There is impacted cerumen.     Left Ear: There is impacted cerumen.  Eyes:     General: No scleral icterus.    Conjunctiva/sclera: Conjunctivae normal.  Neck:     Thyroid: No thyromegaly.  Cardiovascular:     Rate and Rhythm: Normal rate and regular rhythm.     Pulses: Normal pulses.     Heart sounds: Normal heart sounds. No murmur heard. Pulmonary:     Effort: Pulmonary effort is normal. No respiratory distress.     Breath sounds: Normal breath sounds. No wheezing, rhonchi or rales.  Musculoskeletal:     Cervical back: Neck supple.     Right lower leg: No edema.     Left lower leg: No edema.  Lymphadenopathy:     Cervical: No cervical adenopathy.  Skin:    General: Skin is warm and dry.     Findings: No rash.  Neurological:     Mental Status: She is alert and oriented to person, place, and time. Mental status is  at baseline.  Psychiatric:        Mood and Affect: Mood normal.        Behavior: Behavior normal.    No results found for any visits on 10/21/21.  Assessment & Plan     1. Impacted cerumen, unspecified laterality Bilateral hearing loss due to cerumen impaction After soaking with Debrox, ear canals were irrigated with water until left ear canal was cleared. Some residual cerumen could not be cleared from the right ear canal, but patient reported improvement in hearing bilaterally. He can use OTC Debrox drops to clear remaining cerumen.  Patient tolerated procedure well.    2. Vaginal odor - Cervicovaginal ancillary only  3. Screen for STD (sexually transmitted disease) - Cervicovaginal ancillary only  Fu PRN    The patient was advised to call back or seek an  in-person evaluation if the symptoms worsen or if the condition fails to improve as anticipated.  I discussed the assessment and treatment plan with the patient. The patient was provided an opportunity to ask questions and all were answered. The patient agreed with the plan and demonstrated an understanding of the instructions.  The entirety of the information documented in the History of Present Illness, Review of Systems and Physical Exam were personally obtained by me. Portions of this information were initially documented by the CMA and reviewed by me for thoroughness and accuracy.   Portions of this note were created using dictation software and may contain typographical errors.   Mardene Speak, PA-C  [facility] [phone] (phone) [phone] (fax)  [place]

## 2021-10-21 ENCOUNTER — Encounter: Payer: Self-pay | Admitting: Physician Assistant

## 2021-10-21 ENCOUNTER — Other Ambulatory Visit (HOSPITAL_COMMUNITY)
Admission: RE | Admit: 2021-10-21 | Discharge: 2021-10-21 | Disposition: A | Discharge status: Home / Self Care | Payer: BC Managed Care – PPO | Source: Ambulatory Visit | Attending: Physician Assistant | Admitting: Physician Assistant

## 2021-10-21 ENCOUNTER — Ambulatory Visit: Payer: BC Managed Care – PPO | Admitting: Physician Assistant

## 2021-10-21 VITALS — BP 131/75 | HR 85 | Temp 98.6°F | Resp 16 | Wt 279.9 lb

## 2021-10-21 DIAGNOSIS — N760 Acute vaginitis: Secondary | ICD-10-CM | POA: Diagnosis not present

## 2021-10-21 DIAGNOSIS — Z113 Encounter for screening for infections with a predominantly sexual mode of transmission: Secondary | ICD-10-CM | POA: Diagnosis present

## 2021-10-21 DIAGNOSIS — N898 Other specified noninflammatory disorders of vagina: Secondary | ICD-10-CM

## 2021-10-21 DIAGNOSIS — H6120 Impacted cerumen, unspecified ear: Secondary | ICD-10-CM | POA: Diagnosis not present

## 2021-10-21 DIAGNOSIS — Z114 Encounter for screening for human immunodeficiency virus [HIV]: Secondary | ICD-10-CM | POA: Diagnosis present

## 2021-10-26 ENCOUNTER — Encounter: Payer: Self-pay | Admitting: Physician Assistant

## 2021-10-26 ENCOUNTER — Other Ambulatory Visit: Payer: Self-pay | Admitting: Physician Assistant

## 2021-10-26 DIAGNOSIS — B9689 Other specified bacterial agents as the cause of diseases classified elsewhere: Secondary | ICD-10-CM

## 2021-10-26 LAB — CERVICOVAGINAL ANCILLARY ONLY
Bacterial Vaginitis (gardnerella): POSITIVE — AB
Candida Glabrata: NEGATIVE
Candida Vaginitis: NEGATIVE
Chlamydia: NEGATIVE
Comment: NEGATIVE
Comment: NEGATIVE
Comment: NEGATIVE
Comment: NEGATIVE
Comment: NEGATIVE
Comment: NORMAL
Neisseria Gonorrhea: NEGATIVE
Trichomonas: NEGATIVE

## 2021-10-26 MED ORDER — METRONIDAZOLE 500 MG PO TABS: 500.0000 mg | ORAL_TABLET | Freq: Three times a day (TID) | ORAL | 0 refills | Status: DC

## 2021-10-26 MED ORDER — CLINDAMYCIN PHOSPHATE 2 % VA CREA: 1.0000 | TOPICAL_CREAM | Freq: Every day | VAGINAL | 0 refills | Status: DC

## 2021-10-26 MED ORDER — METRONIDAZOLE 500 MG PO TABS: 500.0000 mg | ORAL_TABLET | Freq: Three times a day (TID) | ORAL | 0 refills | Status: AC

## 2021-11-15 ENCOUNTER — Ambulatory Visit: Payer: BC Managed Care – PPO | Admitting: Physician Assistant

## 2021-11-21 ENCOUNTER — Other Ambulatory Visit: Payer: Self-pay | Admitting: Physician Assistant

## 2021-11-21 DIAGNOSIS — L509 Urticaria, unspecified: Secondary | ICD-10-CM

## 2022-06-28 ENCOUNTER — Telehealth: Payer: Self-pay

## 2022-06-28 DIAGNOSIS — K5120 Ulcerative (chronic) proctitis without complications: Secondary | ICD-10-CM

## 2022-06-28 NOTE — Telephone Encounter (Signed)
Patient states she had colonoscopy several years ago but does not know exactly when it was. She states that she is not 52 years old and knows this is now when usually you have colonoscopy done. She wants to know if she is due for one and she states she thought Dr. Servando Snare diagnosed her with Ulcerative colitis. She would like more information.

## 2022-07-02 IMAGING — MG MM DIGITAL SCREENING BILAT W/ TOMO AND CAD
8 series · 8 of 24 positions shown · non-contrast
Comparison: Previous exam(s).

CLINICAL DATA: Screening.

EXAM:
DIGITAL SCREENING BILATERAL MAMMOGRAM WITH TOMOSYNTHESIS AND CAD
TECHNIQUE: Bilateral screening digital craniocaudal and mediolateral oblique
mammograms were obtained. Bilateral screening digital breast
tomosynthesis was performed. The images were evaluated with
computer-aided detection.

[R MLO synth-2D]
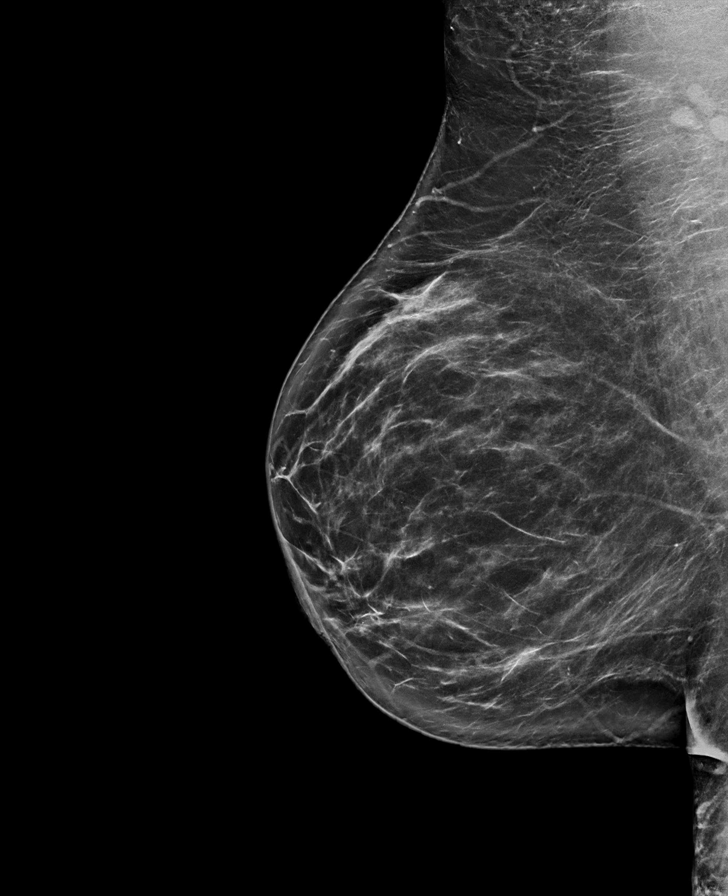

[L CC synth-2D]
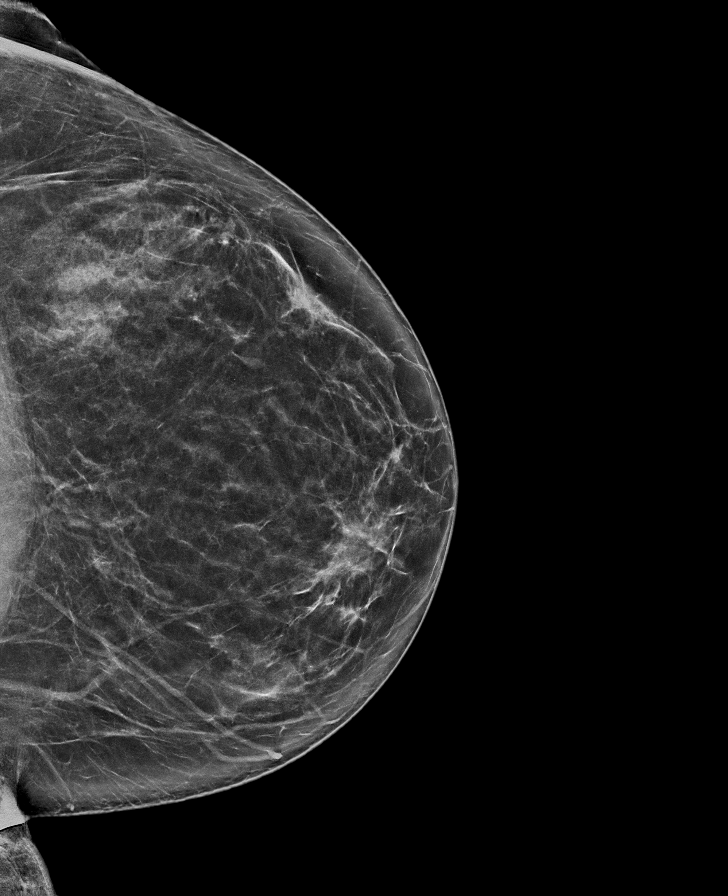

[R CC synth-2D]
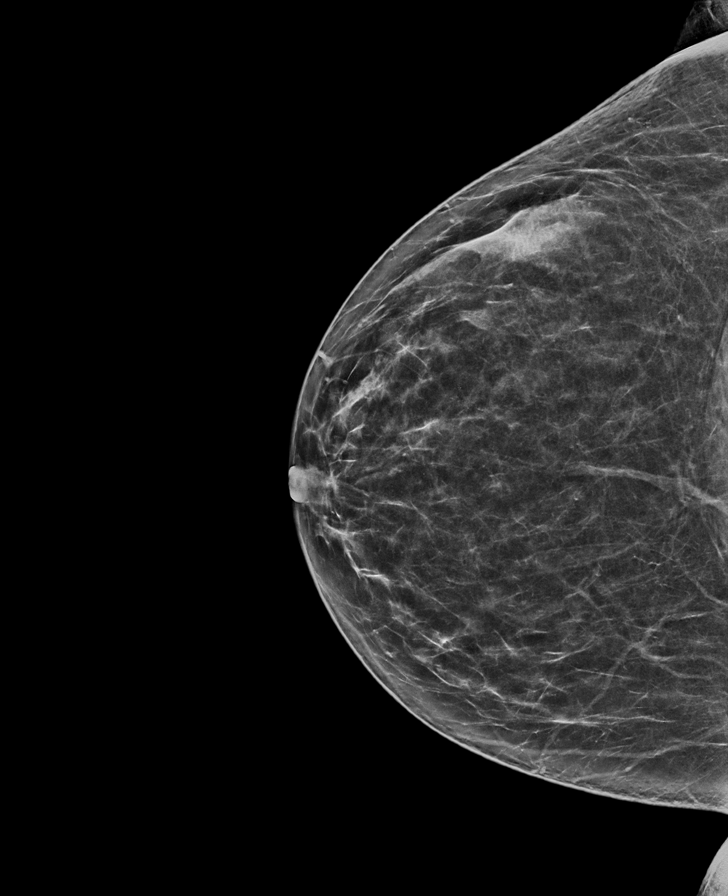

[L MLO synth-2D]
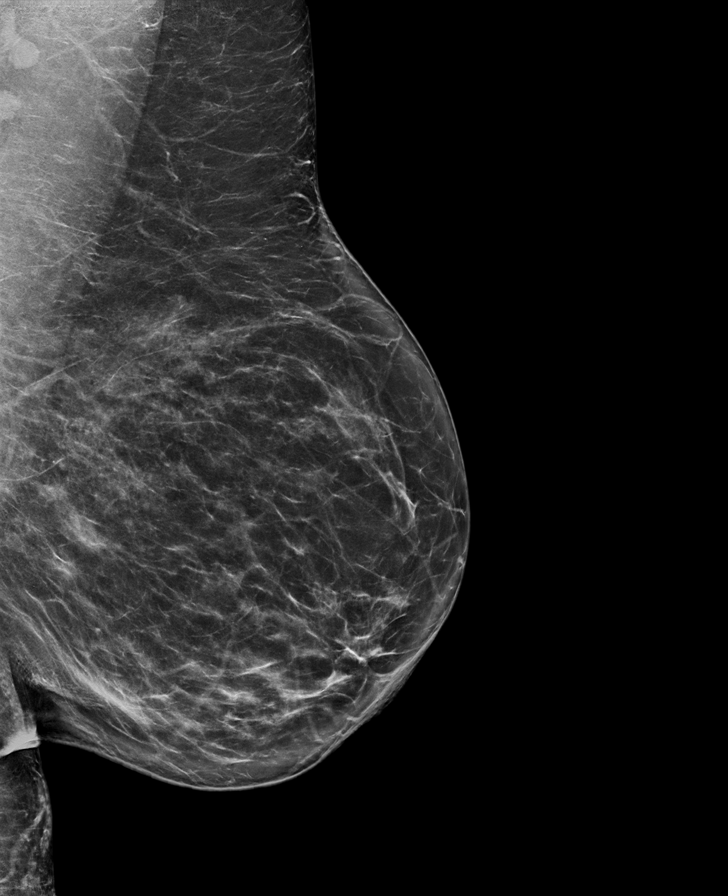

[R CC tomo · tomo slice 38/75.0]
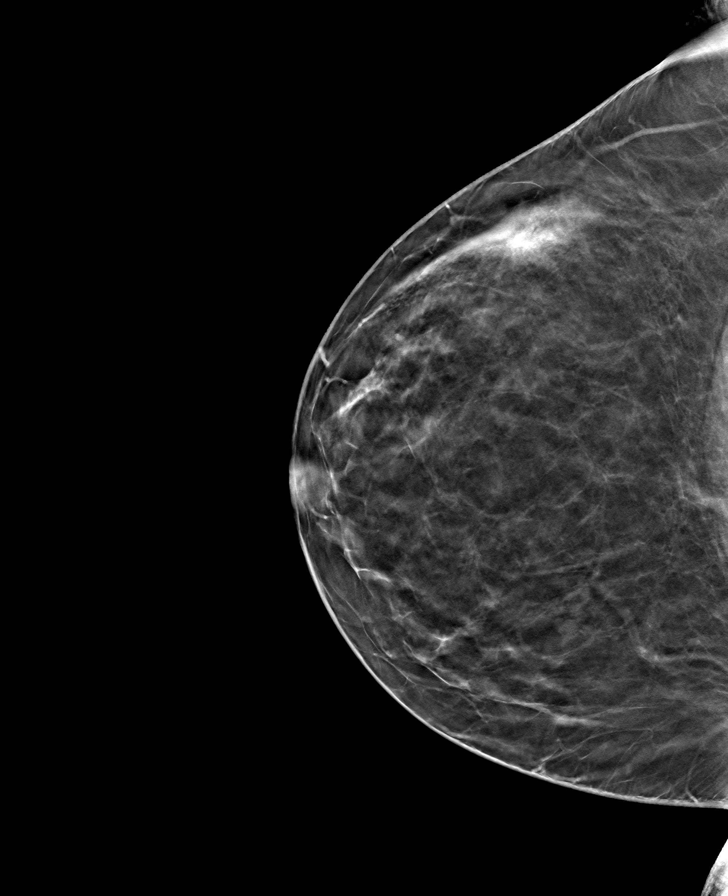

[R MLO tomo · tomo slice 43/85.0]
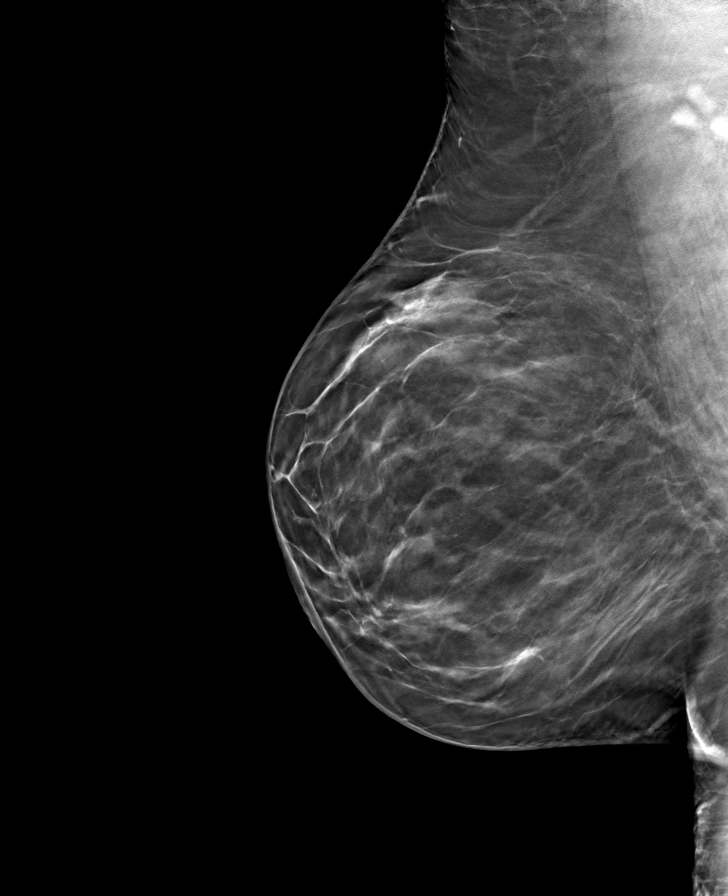

[L MLO tomo · tomo slice 44/87.0]
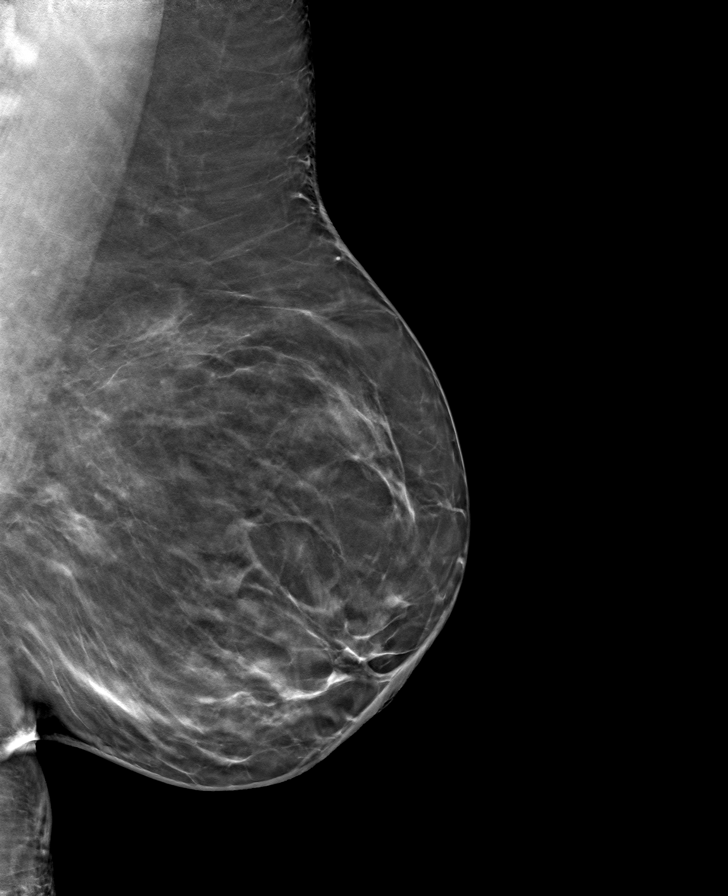

[L CC tomo · tomo slice 41/81.0]
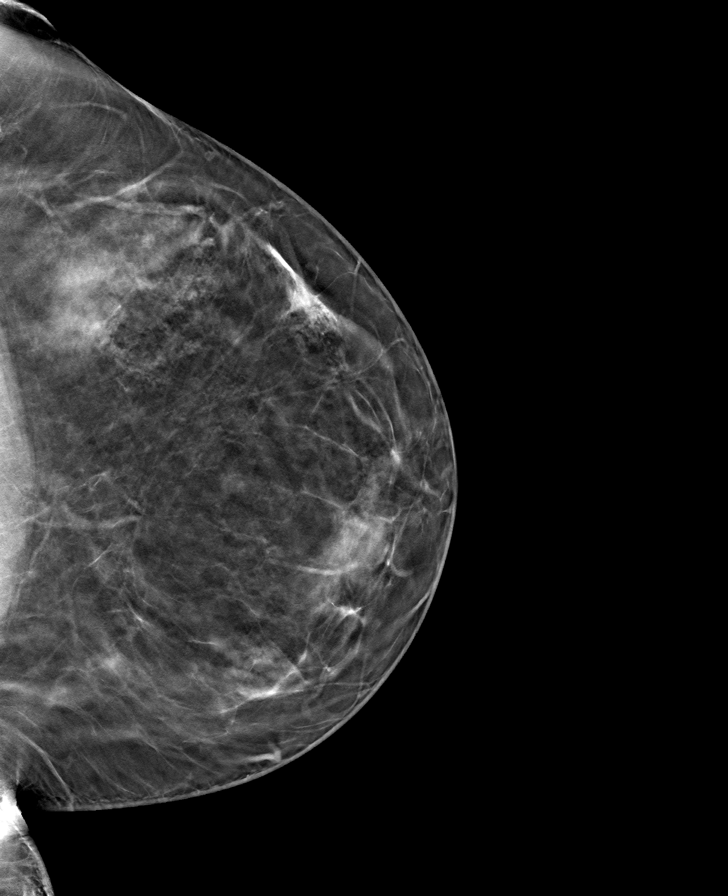

[8 of 24 positions shown; findings below may reference images not displayed]

ACR Breast Density Category b: There are scattered areas of
fibroglandular density.
FINDINGS: In the left breast, a possible asymmetry warrants further
evaluation. In the right breast, no findings suspicious for
malignancy.
IMPRESSION: Further evaluation is suggested for possible asymmetry in the left
breast.

RECOMMENDATION:
Diagnostic mammogram and possibly ultrasound of the left breast.
(Code:[id number])

The patient will be contacted regarding the findings, and additional
imaging will be scheduled.

BI-RADS CATEGORY  0: Incomplete. Need additional imaging evaluation
and/or prior mammograms for comparison.

## 2022-07-05 NOTE — Telephone Encounter (Signed)
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

## 2022-07-26 ENCOUNTER — Other Ambulatory Visit: Payer: Self-pay

## 2022-07-26 DIAGNOSIS — K5120 Ulcerative (chronic) proctitis without complications: Secondary | ICD-10-CM

## 2022-07-26 MED ORDER — NA SULFATE-K SULFATE-MG SULF 17.5-3.13-1.6 GM/177ML PO SOLN
1.0000 | Freq: Once | ORAL | 0 refills | Status: AC
Start: 2022-07-26 — End: 2022-07-26

## 2022-08-03 NOTE — Addendum Note (Signed)
Addended by: Roena Malady on: 08/03/2022 11:11 AM   Modules accepted: Orders

## 2022-08-09 ENCOUNTER — Encounter: Payer: Self-pay | Admitting: Gastroenterology

## 2022-08-15 ENCOUNTER — Encounter: Payer: Self-pay | Admitting: Gastroenterology

## 2022-08-16 ENCOUNTER — Other Ambulatory Visit: Payer: Self-pay

## 2022-08-16 ENCOUNTER — Ambulatory Visit
Admission: RE | Admit: 2022-08-16 | Discharge: 2022-08-16 | Disposition: A | Discharge status: Home / Self Care | Payer: No Typology Code available for payment source | Attending: Gastroenterology | Admitting: Gastroenterology

## 2022-08-16 ENCOUNTER — Encounter
Admission: RE | Disposition: A | Discharge status: Home / Self Care | Payer: Self-pay | Source: Home / Self Care | Attending: Gastroenterology

## 2022-08-16 ENCOUNTER — Encounter: Payer: Self-pay | Admitting: Gastroenterology

## 2022-08-16 ENCOUNTER — Ambulatory Visit: Payer: No Typology Code available for payment source | Admitting: Anesthesiology

## 2022-08-16 DIAGNOSIS — I10 Essential (primary) hypertension: Secondary | ICD-10-CM | POA: Insufficient documentation

## 2022-08-16 DIAGNOSIS — K529 Noninfective gastroenteritis and colitis, unspecified: Secondary | ICD-10-CM | POA: Insufficient documentation

## 2022-08-16 DIAGNOSIS — K635 Polyp of colon: Secondary | ICD-10-CM | POA: Diagnosis not present

## 2022-08-16 DIAGNOSIS — Z79899 Other long term (current) drug therapy: Secondary | ICD-10-CM | POA: Diagnosis not present

## 2022-08-16 DIAGNOSIS — K51211 Ulcerative (chronic) proctitis with rectal bleeding: Secondary | ICD-10-CM | POA: Diagnosis not present

## 2022-08-16 DIAGNOSIS — K5120 Ulcerative (chronic) proctitis without complications: Secondary | ICD-10-CM | POA: Diagnosis present

## 2022-08-16 DIAGNOSIS — Z6841 Body Mass Index (BMI) 40.0 and over, adult: Secondary | ICD-10-CM | POA: Diagnosis not present

## 2022-08-16 HISTORY — PX: COLONOSCOPY WITH PROPOFOL: SHX5780

## 2022-08-16 HISTORY — PX: POLYPECTOMY: SHX5525

## 2022-08-16 HISTORY — PX: BIOPSY: SHX5522

## 2022-08-16 SURGERY — COLONOSCOPY WITH PROPOFOL
Anesthesia: General

## 2022-08-16 MED ORDER — DEXMEDETOMIDINE HCL IN NACL 200 MCG/50ML IV SOLN
INTRAVENOUS | Status: DC | PRN
  Administered 2022-08-16: 8 ug via INTRAVENOUS

## 2022-08-16 MED ORDER — MESALAMINE 1000 MG RE SUPP: 1000.0000 mg | Freq: Every day | RECTAL | 2 refills | Status: DC

## 2022-08-16 MED ORDER — SODIUM CHLORIDE 0.9 % IV SOLN: INTRAVENOUS | Status: DC

## 2022-08-16 MED ORDER — PROPOFOL 500 MG/50ML IV EMUL
INTRAVENOUS | Status: DC | PRN
  Administered 2022-08-16: 150 ug/kg/min via INTRAVENOUS

## 2022-08-16 MED ORDER — LIDOCAINE HCL (CARDIAC) PF 100 MG/5ML IV SOSY
PREFILLED_SYRINGE | INTRAVENOUS | Status: DC | PRN
  Administered 2022-08-16: 40 mg via INTRAVENOUS

## 2022-08-16 MED ORDER — PROPOFOL 10 MG/ML IV BOLUS
INTRAVENOUS | Status: DC | PRN
  Administered 2022-08-16: 80 mg via INTRAVENOUS

## 2022-08-16 NOTE — Transfer of Care (Signed)
Immediate Anesthesia Transfer of Care Note  [name]  Procedure(s) Performed: Procedure(s): COLONOSCOPY WITH PROPOFOL (N/A)  Patient Location: PACU and Endoscopy Unit  Anesthesia Type:General  Level of Consciousness: sedated  Airway & Oxygen Therapy: Patient Spontanous Breathing and Patient connected to nasal cannula oxygen  Post-op Assessment: Report given to RN and Post -op Vital signs reviewed and stable  Post vital signs: Reviewed and stable  Last Vitals:  Vitals:   08/16/22 0957 08/16/22 1113  BP: (!) 142/103 98/62  Pulse: 83 81  Resp: 18 (!) 9  Temp: (!) 36.1 C   SpO2: 99% 96%    Complications: No apparent anesthesia complications

## 2022-08-16 NOTE — Anesthesia Postprocedure Evaluation (Signed)
Anesthesia Post Note  [name]  Procedure(s) Performed: COLONOSCOPY WITH PROPOFOL  Patient location during evaluation: Endoscopy Anesthesia Type: General Level of consciousness: awake and alert Pain management: pain level controlled Vital Signs Assessment: post-procedure vital signs reviewed and stable Respiratory status: spontaneous breathing, nonlabored ventilation, respiratory function stable and patient connected to nasal cannula oxygen Cardiovascular status: blood pressure returned to baseline and stable Postop Assessment: no apparent nausea or vomiting Anesthetic complications: no   No notable events documented.   Last Vitals:  Vitals:   08/16/22 1123 08/16/22 1127  BP:  118/72  Pulse: 74 69  Resp: 15 19  Temp:    SpO2: 97% 98%    Last Pain:  Vitals:   08/16/22 1127  TempSrc:   PainSc: 0-No pain                 Louie Boston

## 2022-08-16 NOTE — H&P (Signed)
Arlyss Repress, MD [address]  [address]  [place], [place] [address]  Main: [phone]  Fax: [phone] Pager: [phone]  Primary Care Physician:  Alfredia Ferguson, PA-C Primary Gastroenterologist:  Dr. Arlyss Repress  Pre-Procedure History & Physical: HPI:  Jamie Fuentes is a 52 y.o. female is here for an colonoscopy.   Past Medical History:  Diagnosis Date   Colitis, ulcerative chronic (HCC)    Hypertension     Past Surgical History:  Procedure Laterality Date   CESAREAN SECTION      Prior to Admission medications   Medication Sig Start Date End Date Taking? Authorizing Provider  amLODipine (NORVASC) 10 MG tablet Take 1 tablet (10 mg total) by mouth daily. 08/11/21  Yes Drubel, Lillia Abed, PA-C  lisinopril-hydrochlorothiazide (ZESTORETIC) 20-12.5 MG tablet Take 1 tablet by mouth daily. 08/11/21  Yes Drubel, Lillia Abed, PA-C  VITAMIN D PO Take by mouth.   Yes [provider]  doxepin (SINEQUAN) 10 MG capsule TAKE 1 CAPSULE TWICE A DAY AS NEEDED Patient not taking: Reported on 08/16/2022 11/21/21   Alfredia Ferguson, PA-C    Allergies as of 07/26/2022 - Review Complete 10/21/2021  Allergen Reaction Noted   Milk (cow)  04/02/2015   Sulfa antibiotics Nausea Only, Rash, and Nausea And Vomiting 04/02/2015    Family History  Problem Relation Age of Onset   Diabetes Father    Hypertension Father    Thyroid cancer Sister    Hypertension Brother    Breast cancer Neg Hx     Social History   Socioeconomic History   Marital status: Married    Spouse name: Not on file   Number of children: Not on file   Years of education: Not on file   Highest education level: Not on file  Occupational History   Not on file  Tobacco Use   Smoking status: Never   Smokeless tobacco: Never  Vaping Use   Vaping Use: Never used  Substance and Sexual Activity   Alcohol use: Yes    Comment: rare   Drug use: No   Sexual activity: Not on file  Other Topics Concern   Not  on file  Social History Narrative   Not on file   Social Determinants of Health   Financial Resource Strain: Not on file  Food Insecurity: Not on file  Transportation Needs: Not on file  Physical Activity: Not on file  Stress: Not on file  Social Connections: Not on file  Intimate Partner Violence: Not on file    Review of Systems: See HPI, otherwise negative ROS  Physical Exam: BP (!) 142/103   Pulse 83   Temp (!) 96.9 F (36.1 C) (Temporal)   Resp 18   Ht 5' 4.5" (1.638 m)   Wt 118.4 kg   SpO2 99%   BMI 44.11 kg/m  General:   Alert,  pleasant and cooperative in NAD Head:  Normocephalic and atraumatic. Neck:  Supple; no masses or thyromegaly. Lungs:  Clear throughout to auscultation.    Heart:  Regular rate and rhythm. Abdomen:  Soft, nontender and nondistended. Normal bowel sounds, without guarding, and without rebound.   Neurologic:  Alert and  oriented x4;  grossly normal neurologically.  Impression/Plan: Jamie Fuentes is here for an colonoscopy to be performed for ulcerative colitis  Risks, benefits, limitations, and alternatives regarding  colonoscopy have been reviewed with the patient.  Questions have been answered.  All parties agreeable.   Lannette Donath, MD  08/16/2022, 10:01 AM

## 2022-08-16 NOTE — Op Note (Signed)
[facility] Patient Name: Jamie Fuentes Procedure Date: 08/16/2022 10:32 AM MRN: [id number] Account #: [id number] Date of Birth: December 18, 1970 Admit Type: Outpatient Age: 52 Room: [facility] ENDO ROOM 4 Gender: Female Note Status: Finalized Instrument Name: Colonoscope [id number] Procedure:             Colonoscopy Indications:           Chronic ulcerative proctitis Providers:             Toney Reil MD, MD Referring MD:          Toney Reil MD, MD (Referring MD), Alfredia Ferguson (Referring MD) Medicines:             General Anesthesia Complications:         No immediate complications. Estimated blood loss: None. Procedure:             Pre-Anesthesia Assessment:                        - Prior to the procedure, a History and Physical was                         performed, and patient medications and allergies were                         reviewed. The patient is competent. The risks and                         benefits of the procedure and the sedation options and                         risks were discussed with the patient. All questions                         were answered and informed consent was obtained.                         Patient identification and proposed procedure were                         verified by the physician, the nurse, the                         anesthesiologist, the anesthetist and the technician                         in the pre-procedure area in the procedure room in the                         endoscopy suite. Mental Status Examination: alert and                         oriented. Airway Examination: normal oropharyngeal                         airway and neck mobility. Respiratory Examination:  clear to auscultation. CV Examination: normal.                         Prophylactic Antibiotics: The patient does not require                         prophylactic antibiotics.  Prior Anticoagulants: The                         patient has taken no anticoagulant or antiplatelet                         agents. ASA Grade Assessment: III - A patient with                         severe systemic disease. After reviewing the risks and                         benefits, the patient was deemed in satisfactory                         condition to undergo the procedure. The anesthesia                         plan was to use general anesthesia. Immediately prior                         to administration of medications, the patient was                         re-assessed for adequacy to receive sedatives. The                         heart rate, respiratory rate, oxygen saturations,                         blood pressure, adequacy of pulmonary ventilation, and                         response to care were monitored throughout the                         procedure. The physical status of the patient was                         re-assessed after the procedure.                        After obtaining informed consent, the colonoscope was                         passed under direct vision. Throughout the procedure,                         the patient's blood pressure, pulse, and oxygen                         saturations were monitored continuously. The  Colonoscope was introduced through the anus and                         advanced to the the terminal ileum, with                         identification of the appendiceal orifice and IC                         valve. The colonoscopy was performed without                         difficulty. The patient tolerated the procedure well.                         The quality of the bowel preparation was evaluated                         using the BBPS [facility] Bowel Preparation Scale) with                         scores of: Right Colon = 3, Transverse Colon = 3 and                         Left Colon = 3 (entire mucosa  seen well with no                         residual staining, small fragments of stool or opaque                         liquid). The total BBPS score equals 9. The terminal                         ileum, ileocecal valve, appendiceal orifice, and                         rectum were photographed. Findings:      The perianal and digital rectal examinations were normal. Pertinent       negatives include normal sphincter tone and no palpable rectal lesions.      Inflammation was found in a continuous and circumferential pattern from       the anus to the rectum. This was graded as Mayo Score 1 (mild, with       erythema, decreased vascular pattern, mild friability), and when       compared to the previous examination, the findings are unchanged.       Biopsies were taken with a cold forceps for histology.      Normal mucosa was found in the sigmoid colon, in the descending colon,       in the transverse colon, in the ascending colon and in the cecum.       Biopsies were taken with a cold forceps for histology. Estimated blood       loss: none.      The terminal ileum appeared normal.      A diminutive polyp was found in the cecum. The polyp was sessile. The       polyp was removed with a cold biopsy forceps. Resection and  retrieval       were complete. 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 Biopsied.                        - Normal mucosa in the sigmoid colon, in the                         descending colon, in the transverse colon, in the                         ascending colon and in the cecum. Biopsied.                        - The examined portion of the ileum was normal.                        - One diminutive polyp in the cecum, removed with a                         cold biopsy forceps. Resected and retrieved. Recommendation:        - Discharge patient to home (with escort).                         - Resume previous diet today.                        - Continue present medications.                        - Await pathology results.                        - Return to my office as previously scheduled. Procedure Code(s):     --- Professional ---                        [phone], Colonoscopy, flexible; with biopsy, single or                         multiple Diagnosis Code(s):     --- Professional ---                        K51.20, Ulcerative (chronic) proctitis without                         complications CPT copyright 2022 American Medical Association. All rights reserved. The codes documented in this report are preliminary and upon coder review may  be revised to meet current compliance requirements. Dr. Libby Maw Toney Reil MD, MD 08/16/2022 11:16:22 AM This report has been signed electronically. Number of Addenda: 0 Note Initiated On: 08/16/2022 10:32 AM Scope Withdrawal Time: 0 hours 14 minutes 45 seconds  Total Procedure Duration: 0 hours 16 minutes 32 seconds  Estimated Blood Loss:  Estimated blood loss: none. Estimated blood loss: none.      [facility]

## 2022-08-16 NOTE — Anesthesia Preprocedure Evaluation (Signed)
Anesthesia Evaluation  Patient identified by MRN, date of birth, ID band Patient awake    Reviewed: Allergy & Precautions, NPO status , Patient's Chart, lab work & pertinent test results  History of Anesthesia Complications Negative for: history of anesthetic complications  Airway Mallampati: III  TM Distance: >3 FB Neck ROM: full    Dental no notable dental hx.    Pulmonary neg pulmonary ROS   Pulmonary exam normal        Cardiovascular hypertension, On Medications negative cardio ROS Normal cardiovascular exam     Neuro/Psych negative neurological ROS  negative psych ROS   GI/Hepatic Neg liver ROS, PUD,,,  Endo/Other    Morbid obesity  Renal/GU negative Renal ROS  negative genitourinary   Musculoskeletal   Abdominal   Peds  Hematology negative hematology ROS (+)   Anesthesia Other Findings Past Medical History: No date: Colitis, ulcerative chronic (HCC) No date: Hypertension  Past Surgical History: No date: CESAREAN SECTION  BMI    Body Mass Index: 44.11 kg/m      Reproductive/Obstetrics negative OB ROS                             Anesthesia Physical Anesthesia Plan  ASA: 3  Anesthesia Plan: General   Post-op Pain Management: Minimal or no pain anticipated   Induction: Intravenous  PONV Risk Score and Plan: 2 and Propofol infusion and TIVA  Airway Management Planned: Natural Airway and Nasal Cannula  Additional Equipment:   Intra-op Plan:   Post-operative Plan:   Informed Consent: I have reviewed the patients History and Physical, chart, labs and discussed the procedure including the risks, benefits and alternatives for the proposed anesthesia with the patient or authorized representative who has indicated his/her understanding and acceptance.     Dental Advisory Given  Plan Discussed with: Anesthesiologist, CRNA and Surgeon  Anesthesia Plan Comments:  (Patient consented for risks of anesthesia including but not limited to:  - adverse reactions to medications - risk of airway placement if required - damage to eyes, teeth, lips or other oral mucosa - nerve damage due to positioning  - sore throat or hoarseness - Damage to heart, brain, nerves, lungs, other parts of body or loss of life  Patient voiced understanding.)       Anesthesia Quick Evaluation

## 2022-08-17 ENCOUNTER — Encounter: Payer: Self-pay | Admitting: Gastroenterology

## 2022-10-31 ENCOUNTER — Other Ambulatory Visit: Payer: Self-pay | Admitting: Physician Assistant

## 2022-10-31 DIAGNOSIS — I10 Essential (primary) hypertension: Secondary | ICD-10-CM

## 2022-10-31 NOTE — Telephone Encounter (Signed)
Medication Refill - Medication: lisinopril-hydrochlorothiazide (ZESTORETIC) 20-12.5 MG tablet [[id number]    amLODipine (NORVASC) 10 MG tablet    Has the patient contacted their pharmacy?  No     Preferred Pharmacy?  [facility] [address], [place] - [address]  [address] [place] [address]  Phone: [phone] Fax: [phone]  Hours: Not open 24 hours      Has the patient been seen for an appointment in the last year OR does the patient have an upcoming appointment? Yes

## 2022-11-02 ENCOUNTER — Telehealth: Payer: Self-pay | Admitting: Physician Assistant

## 2022-11-02 MED ORDER — AMLODIPINE BESYLATE 10 MG PO TABS: 10.0000 mg | ORAL_TABLET | Freq: Every day | ORAL | 3 refills | Status: DC

## 2022-11-02 MED ORDER — LISINOPRIL-HYDROCHLOROTHIAZIDE 20-12.5 MG PO TABS
1.0000 | ORAL_TABLET | Freq: Every day | ORAL | 3 refills | Status: DC
Start: 2022-11-02 — End: 2023-10-24

## 2022-11-02 NOTE — Telephone Encounter (Signed)
Requested medications are due for refill today.  yes  Requested medications are on the active medications list.  yes  Last refill. 08/11/2021 #90 3 rf for both  Future visit scheduled.   yes  Notes to clinic.  Alfredia Ferguson listed as PCP.    Requested Prescriptions  Pending Prescriptions Disp Refills   lisinopril-hydrochlorothiazide (ZESTORETIC) 20-12.5 MG tablet 90 tablet 3    Sig: Take 1 tablet by mouth daily.     Cardiovascular:  ACEI + Diuretic Combos Failed - 10/31/2022  4:15 PM      Failed - Na in normal range and within 180 days    Sodium  Date Value Ref Range Status  08/11/2021 141 134 - 144 mmol/L Final         Failed - K in normal range and within 180 days    Potassium  Date Value Ref Range Status  08/11/2021 3.9 3.5 - 5.2 mmol/L Final         Failed - Cr in normal range and within 180 days    Creatinine, Ser  Date Value Ref Range Status  08/11/2021 0.84 0.57 - 1.00 mg/dL Final         Failed - eGFR is 30 or above and within 180 days    GFR calc Af Amer  Date Value Ref Range Status  04/22/2020 115 >59 mL/min/1.73 Final    Comment:    **In accordance with recommendations from the NKF-ASN Task force,**   [facility] is in the process of updating its eGFR calculation to the   2021 CKD-EPI creatinine equation that estimates kidney function   without a race variable.    GFR calc non Af Amer  Date Value Ref Range Status  04/22/2020 100 >59 mL/min/1.73 Final   eGFR  Date Value Ref Range Status  08/11/2021 85 >59 mL/min/1.73 Final         Failed - Valid encounter within last 6 months    Recent Outpatient Visits           1 year ago Impacted cerumen, unspecified laterality   [facility], [place], PA-C   1 year ago Encounter for annual health examination   [facility] Alfredia Ferguson, [place]   2 years ago Annual physical exam   [facility] [place], Alessandra Bevels, [place]   2  years ago Skin tag   [facility] [place], Alessandra Bevels, [place]   3 years ago Annual physical exam   [facility], Alessandra [place], [place]       Future Appointments             In 2 weeks Debera Lat, PA-C [facility], [facility]            Passed - Patient is not pregnant      Passed - Last BP in normal range    BP Readings from Last 1 Encounters:  08/16/22 118/72          amLODipine (NORVASC) 10 MG tablet 90 tablet 3    Sig: Take 1 tablet (10 mg total) by mouth daily.     Cardiovascular: Calcium Channel Blockers 2 Failed - 10/31/2022  4:15 PM      Failed - Valid encounter within last 6 months    Recent Outpatient Visits           1 year ago Impacted cerumen, unspecified laterality   [facility] [place], [place], PA-C   1 year ago Encounter for annual health examination   [facility] Alfredia Ferguson, [place]   2 years ago Annual physical exam   [facility] [place], Alessandra Bevels, [place]   2 years ago Skin tag   [facility] [place], Alessandra Bevel[place], [place]   3 years ago Annual physical exam   [facility] [place], Alessandra Bevel[place], [place]       Future Appointments             In 2 weeks [place], [place], PA-C [facility]            Passed - Last BP in normal range    BP Readings from Last 1 Encounters:  08/16/22 118/72         Passed - Last Heart Rate in normal range    Pulse Readings from Last 1 Encounters:  08/16/22 69

## 2022-11-02 NOTE — Telephone Encounter (Signed)
Med for blood pressure were called in. Please, schedule a FU. Your last registered BP was elevated. Last OV was a year ago.  On behalf of Jamie Fuentes, [facility], MMS [facility] [phone] (phone) [phone] (fax)

## 2022-11-16 ENCOUNTER — Encounter: Payer: BC Managed Care – PPO | Admitting: Physician Assistant

## 2022-11-26 NOTE — Progress Notes (Unsigned)
Complete physical exam  [name]   DOB: [birth year]   52 y.o. Female  MRN: [id number] Visit Date: 11/28/2022  Today's healthcare provider: Debera Lat, PA-C   No chief complaint on file.  Subjective    Jamie Fuentes is a 52 y.o. female who presents today for a complete physical exam.  She reports consuming a {diet types:17450} diet. {Exercise:19826} She generally feels {well/fairly well/poorly:18703}. She reports sleeping {well/fairly well/poorly:18703}. She {does/does not:200015} have additional problems to discuss today.  HPI  *** Discussed the use of AI scribe software for clinical note transcription with the patient, who gave verbal consent to proceed.  History of Present Illness            Last depression screening scores    10/21/2021    9:23 AM 08/11/2021    8:48 AM 04/22/2020    8:31 AM  PHQ 2/9 Scores  PHQ - 2 Score 0 0 0  PHQ- 9 Score 3 2 1    Last fall risk screening    10/21/2021    9:23 AM  Fall Risk   Falls in the past year? 0  Number falls in past yr: 0  Injury with Fall? 0  Follow up Falls evaluation completed   Last Audit-C alcohol use screening    10/21/2021    9:24 AM  Alcohol Use Disorder Test (AUDIT)  1. How often do you have a drink containing alcohol? 1  2. How many drinks containing alcohol do you have on a typical day when you are drinking? 0  3. How often do you have six or more drinks on one occasion? 0  AUDIT-C Score 1   A score of 3 or more in women, and 4 or more in men indicates increased risk for alcohol abuse, EXCEPT if all of the points are from question 1   Past Medical History:  Diagnosis Date  . Colitis, ulcerative chronic (HCC)   . Hypertension    Past Surgical History:  Procedure Laterality Date  . BIOPSY  08/16/2022   Procedure: BIOPSY;  Surgeon: Toney Reil, MD;  Location[facility] ENDOSCOPY;  Service: Gastroenterology;;  . CESAREAN SECTION    . COLONOSCOPY WITH PROPOFOL N/A 08/16/2022    Procedure: COLONOSCOPY WITH PROPOFOL;  Surgeon: Toney Reil, MD;  Location: [facility] ENDOSCOPY;  Service: Gastroenterology;  Laterality: N/A;  . POLYPECTOMY  08/16/2022   Procedure: POLYPECTOMY;  Surgeon: Toney Reil, MD;  Location: [facility] ENDOSCOPY;  Service: Gastroenterology;;   Social History   Socioeconomic History  . Marital status: Married    Spouse name: Not on file  . Number of children: Not on file  . Years of education: Not on file  . Highest education level: Not on file  Occupational History  . Not on file  Tobacco Use  . Smoking status: Never  . Smokeless tobacco: Never  Vaping Use  . Vaping status: Never Used  Substance and Sexual Activity  . Alcohol use: Yes    Comment: rare  . Drug use: No  . Sexual activity: Not on file  Other Topics Concern  . Not on file  Social History Narrative  . Not on file   Social Determinants of Health   Financial Resource Strain: Not on file  Food Insecurity: Not on file  Transportation Needs: Not on file  Physical Activity: Not on file  Stress: Not on file  Social Connections: Not on file  Intimate Partner Violence: Not on file   Family  Status  Relation Name Status  . Mother  Alive  . Father  Alive  . Sister  Alive  . Brother  Alive  . Brother  Alive  . Neg Hx  (Not Specified)  No partnership data on file   Family History  Problem Relation Age of Onset  . Diabetes Father   . Hypertension Father   . Thyroid cancer Sister   . Hypertension Brother   . Breast cancer Neg Hx    Allergies  Allergen Reactions  . Milk (Cow)   . Sulfa Antibiotics Nausea Only, Rash and Nausea And Vomiting    Patient Care Team: Burnett Corrente as PCP - General (Physician Assistant)   Medications: Outpatient Medications Prior to Visit  Medication Sig  . amLODipine (NORVASC) 10 MG tablet Take 1 tablet (10 mg total) by mouth daily.  Marland Kitchen lisinopril-hydrochlorothiazide (ZESTORETIC) 20-12.5 MG tablet Take 1 tablet by mouth  daily.  . mesalamine (CANASA) 1000 MG suppository Place 1 suppository (1,000 mg total) rectally at bedtime.  Marland Kitchen VITAMIN D PO Take by mouth.   No facility-administered medications prior to visit.    Review of Systems  All other systems reviewed and are negative. Except see HPI  {Insert previous labs (optional):23779} {See past labs  Heme  Chem  Endocrine  Serology  Results Review (optional):1}  Objective    There were no vitals taken for this visit. {Insert last BP/Wt (optional):23777}{See vitals history (optional):1}    Physical Exam Vitals reviewed.  Constitutional:      General: She is not in acute distress.    Appearance: Normal appearance. She is well-developed. She is not ill-appearing, toxic-appearing or diaphoretic.  HENT:     Head: Normocephalic and atraumatic.     Right Ear: Tympanic membrane, ear canal and external ear normal.     Left Ear: Tympanic membrane, ear canal and external ear normal.     Nose: Nose normal. No congestion or rhinorrhea.     Mouth/Throat:     Mouth: Mucous membranes are moist.     Pharynx: Oropharynx is clear. No oropharyngeal exudate.  Eyes:     General: No scleral icterus.       Right eye: No discharge.        Left eye: No discharge.     Conjunctiva/sclera: Conjunctivae normal.     Pupils: Pupils are equal, round, and reactive to light.  Neck:     Thyroid: No thyromegaly.     Vascular: No carotid bruit.  Cardiovascular:     Rate and Rhythm: Normal rate and regular rhythm.     Pulses: Normal pulses.     Heart sounds: Normal heart sounds. No murmur heard.    No friction rub. No gallop.  Pulmonary:     Effort: Pulmonary effort is normal. No respiratory distress.     Breath sounds: Normal breath sounds. No wheezing or rales.  Abdominal:     General: Abdomen is flat. Bowel sounds are normal. There is no distension.     Palpations: Abdomen is soft. There is no mass.     Tenderness: There is no abdominal tenderness. There is no  right CVA tenderness, left CVA tenderness, guarding or rebound.     Hernia: No hernia is present.  Musculoskeletal:        General: No swelling, tenderness, deformity or signs of injury. Normal range of motion.     Cervical back: Normal range of motion and neck supple. No rigidity or tenderness.  Right lower leg: No edema.     Left lower leg: No edema.  Lymphadenopathy:     Cervical: No cervical adenopathy.  Skin:    General: Skin is warm and dry.     Coloration: Skin is not jaundiced or pale.     Findings: No bruising, erythema, lesion or rash.  Neurological:     Mental Status: She is alert and oriented to person, place, and time. Mental status is at baseline.     Gait: Gait normal.  Psychiatric:        Mood and Affect: Mood normal.        Behavior: Behavior normal.        Thought Content: Thought content normal.        Judgment: Judgment normal.     No results found for any visits on 11/28/22.  Assessment & Plan    Routine Health Maintenance and Physical Exam  Exercise Activities and Dietary recommendations  Goals   None     Immunization History  Administered Date(s) Administered  . Influenza Split 02/10/2010, 02/13/2011, 04/05/2012  . Influenza,inj,Quad PF,6+ Mos 11/11/2015, 12/21/2016, 01/02/2018, 01/09/2020  . Td 01/02/2018  . Tdap 04/30/2006    Health Maintenance  Topic Date Due  . COVID-19 Vaccine (1) Never done  . Zoster (Shingles) Vaccine (1 of 2) Never done  . Mammogram  09/10/2022  . Flu Shot  09/28/2022  . Pap with HPV screening  08/12/2026  . DTaP/Tdap/Td vaccine (3 - Td or Tdap) 01/03/2028  . Colon Cancer Screening  08/15/2032  . Hepatitis C Screening  Completed  . HPV Vaccine  Aged Out  . HIV Screening  Discontinued    Discussed health benefits of physical activity, and encouraged her to engage in regular exercise appropriate for her age and condition.  Assessment and Plan              ***  No follow-ups on file.    The patient  was advised to call back or seek an in-person evaluation if the symptoms worsen or if the condition fails to improve as anticipated.  I discussed the assessment and treatment plan with the patient. The patient was provided an opportunity to ask questions and all were answered. The patient agreed with the plan and demonstrated an understanding of the instructions.  I, Debera Lat, PA-C have reviewed all documentation for this visit. The documentation on  11/28/22 for the exam, diagnosis, procedures, and orders are all accurate and complete.  Debera Lat, [facility], MMS [facility] [phone] (phone) [phone] (fax)  [facility]

## 2022-11-28 ENCOUNTER — Ambulatory Visit (INDEPENDENT_AMBULATORY_CARE_PROVIDER_SITE_OTHER): Payer: No Typology Code available for payment source | Admitting: Physician Assistant

## 2022-11-28 ENCOUNTER — Encounter: Payer: Self-pay | Admitting: Physician Assistant

## 2022-11-28 VITALS — BP 133/75 | HR 94 | Temp 98.8°F | Ht 64.5 in | Wt 268.0 lb

## 2022-11-28 DIAGNOSIS — Z0000 Encounter for general adult medical examination without abnormal findings: Secondary | ICD-10-CM

## 2022-11-28 DIAGNOSIS — Z23 Encounter for immunization: Secondary | ICD-10-CM | POA: Diagnosis not present

## 2022-12-20 LAB — COMPREHENSIVE METABOLIC PANEL
ALT: 34 [IU]/L — ABNORMAL HIGH (ref 0–32)
AST: 26 [IU]/L (ref 0–40)
Albumin: 4.1 g/dL (ref 3.9–4.9)
Alkaline Phosphatase: 52 [IU]/L (ref 44–121)
BUN/Creatinine Ratio: 19 (ref 9–23)
BUN: 15 mg/dL (ref 6–24)
Bilirubin Total: 0.2 mg/dL (ref 0.0–1.2)
CO2: 22 mmol/L (ref 20–29)
Calcium: 9.3 mg/dL (ref 8.7–10.2)
Chloride: 102 mmol/L (ref 96–106)
Creatinine, Ser: 0.8 mg/dL (ref 0.57–1.00)
Globulin, Total: 2.4 g/dL (ref 1.5–4.5)
Glucose: 123 mg/dL — ABNORMAL HIGH (ref 70–99)
Potassium: 3.9 mmol/L (ref 3.5–5.2)
Sodium: 140 mmol/L (ref 134–144)
Total Protein: 6.5 g/dL (ref 6.0–8.5)
eGFR: 90 mL/min/{1.73_m2} (ref >59)

## 2022-12-20 LAB — CBC WITH DIFFERENTIAL/PLATELET
Basophils Absolute: 0 10*3/uL (ref 0.0–0.2)
Basos: 0 %
EOS (ABSOLUTE): 0 10*3/uL (ref 0.0–0.4)
Eos: 0 %
Hematocrit: 43 % (ref 34.0–46.6)
Hemoglobin: 13.8 g/dL (ref 11.1–15.9)
Immature Grans (Abs): 0 10*3/uL (ref 0.0–0.1)
Immature Granulocytes: 0 %
Lymphocytes Absolute: 3.2 10*3/uL — ABNORMAL HIGH (ref 0.7–3.1)
Lymphs: 39 %
MCH: 30 pg (ref 26.6–33.0)
MCHC: 32.1 g/dL (ref 31.5–35.7)
MCV: 94 fL (ref 79–97)
Monocytes Absolute: 0.5 10*3/uL (ref 0.1–0.9)
Monocytes: 5 %
Neutrophils Absolute: 4.6 10*3/uL (ref 1.4–7.0)
Neutrophils: 56 %
Platelets: 333 10*3/uL (ref 150–450)
RBC: 4.6 x10E6/uL (ref 3.77–5.28)
RDW: 11.9 % (ref 11.7–15.4)
WBC: 8.3 10*3/uL (ref 3.4–10.8)

## 2022-12-20 LAB — LIPID PANEL
Chol/HDL Ratio: 4.3 {ratio} (ref 0.0–4.4)
Cholesterol, Total: 198 mg/dL (ref 100–199)
HDL: 46 mg/dL (ref >39)
LDL Chol Calc (NIH): 118 mg/dL — ABNORMAL HIGH (ref 0–99)
Triglycerides: 195 mg/dL — ABNORMAL HIGH (ref 0–149)
VLDL Cholesterol Cal: 34 mg/dL (ref 5–40)

## 2022-12-20 LAB — HEMOGLOBIN A1C
Est. average glucose Bld gHb Est-mCnc: 134 mg/dL
Hgb A1c MFr Bld: 6.3 % — ABNORMAL HIGH (ref 4.8–5.6)

## 2022-12-20 LAB — TSH: TSH: 2.76 u[IU]/mL (ref 0.450–4.500)

## 2022-12-22 ENCOUNTER — Encounter: Payer: Self-pay | Admitting: Physician Assistant

## 2022-12-22 ENCOUNTER — Other Ambulatory Visit: Payer: Self-pay | Admitting: Physician Assistant

## 2022-12-22 DIAGNOSIS — Z1231 Encounter for screening mammogram for malignant neoplasm of breast: Secondary | ICD-10-CM

## 2022-12-22 DIAGNOSIS — I10 Essential (primary) hypertension: Secondary | ICD-10-CM

## 2022-12-22 DIAGNOSIS — E782 Mixed hyperlipidemia: Secondary | ICD-10-CM

## 2022-12-25 MED ORDER — SEMAGLUTIDE-WEIGHT MANAGEMENT 0.25 MG/0.5ML ~~LOC~~ SOAJ
0.2500 mg | SUBCUTANEOUS | 0 refills | Status: DC
Start: 2022-12-25 — End: 2023-01-17

## 2023-01-01 NOTE — Telephone Encounter (Signed)
PA initiated today.  ?

## 2023-01-02 ENCOUNTER — Ambulatory Visit
Admission: RE | Admit: 2023-01-02 | Discharge: 2023-01-02 | Disposition: A | Discharge status: Home / Self Care | Payer: No Typology Code available for payment source | Source: Ambulatory Visit | Attending: Physician Assistant | Admitting: Physician Assistant

## 2023-01-02 DIAGNOSIS — Z1231 Encounter for screening mammogram for malignant neoplasm of breast: Secondary | ICD-10-CM | POA: Diagnosis not present

## 2023-01-17 ENCOUNTER — Other Ambulatory Visit: Payer: Self-pay | Admitting: Physician Assistant

## 2023-01-17 DIAGNOSIS — I10 Essential (primary) hypertension: Secondary | ICD-10-CM

## 2023-01-17 DIAGNOSIS — E782 Mixed hyperlipidemia: Secondary | ICD-10-CM

## 2023-01-20 MED ORDER — SEMAGLUTIDE-WEIGHT MANAGEMENT 0.25 MG/0.5ML ~~LOC~~ SOAJ
0.2500 mg | SUBCUTANEOUS | 0 refills | Status: DC
Start: 2023-01-20 — End: 2023-01-30

## 2023-01-22 ENCOUNTER — Telehealth: Payer: Self-pay | Admitting: Physician Assistant

## 2023-01-22 NOTE — Telephone Encounter (Signed)
Receive fax from covermy meds for Ozempic .25 mg. Or 0.5 mg   Key::  [id number]

## 2023-01-26 ENCOUNTER — Other Ambulatory Visit: Payer: Self-pay | Admitting: Physician Assistant

## 2023-01-26 DIAGNOSIS — I10 Essential (primary) hypertension: Secondary | ICD-10-CM

## 2023-01-26 DIAGNOSIS — E782 Mixed hyperlipidemia: Secondary | ICD-10-CM

## 2023-01-29 ENCOUNTER — Encounter: Payer: Self-pay | Admitting: Physician Assistant

## 2023-01-29 NOTE — Telephone Encounter (Signed)
Responded to patient via mychart

## 2023-01-30 ENCOUNTER — Telehealth: Payer: Self-pay

## 2023-01-30 ENCOUNTER — Other Ambulatory Visit: Payer: Self-pay | Admitting: Physician Assistant

## 2023-01-30 DIAGNOSIS — E782 Mixed hyperlipidemia: Secondary | ICD-10-CM

## 2023-01-30 DIAGNOSIS — I10 Essential (primary) hypertension: Secondary | ICD-10-CM

## 2023-01-30 NOTE — Telephone Encounter (Signed)
Pt called saying she never got the refill from last week that would be for this week.  Please contact patient.[phone]

## 2023-01-30 NOTE — Telephone Encounter (Signed)
Refilled today in a separate refill encounter.

## 2023-01-30 NOTE — Telephone Encounter (Signed)
Requested Prescriptions  Pending Prescriptions Disp Refills   WEGOVY 0.25 MG/0.5ML SOAJ [Pharmacy Med Name: Wegovy 0.25 MG/0.5ML Subcutaneous Solution Auto-injector] 4 mL 0    Sig: INJECT ONE SYRINGEFUL INTO THE SKIN ONCE WEEKLY     Endocrinology:  Diabetes - GLP-1 Receptor Agonists - semaglutide Failed - 01/30/2023  3:28 PM      Failed - HBA1C in normal range and within 180 days    Hgb A1c MFr Bld  Date Value Ref Range Status  12/19/2022 6.3 (H) 4.8 - 5.6 % Final    Comment:             Prediabetes: 5.7 - 6.4          Diabetes: >6.4          Glycemic control for adults with diabetes: <7.0          Passed - Cr in normal range and within 360 days    Creatinine, Ser  Date Value Ref Range Status  12/19/2022 0.80 0.57 - 1.00 mg/dL Final         Passed - Valid encounter within last 6 months    Recent Outpatient Visits           2 months ago Annual physical exam   [place] [place], [place], PA-C   1 year ago Impacted cerumen, unspecified laterality   [place] [facility] [place], [place], PA-C   1 year ago Encounter for annual health examination   [facility] Alfredia [place], [place]   2 years ago Annual physical exam   [facility] [place], Alessandra [place], [place]   3 years ago Skin tag   [facility] [place], [place], [place]

## 2023-01-30 NOTE — Telephone Encounter (Signed)
Call from [facility] pharmacy - Marchelle Folks  - pt there with her. [facility] did not receive refill on 01/20/2023 as indicated in chart.

## 2023-02-13 ENCOUNTER — Other Ambulatory Visit: Payer: Self-pay | Admitting: Physician Assistant

## 2023-02-13 DIAGNOSIS — E782 Mixed hyperlipidemia: Secondary | ICD-10-CM

## 2023-02-13 DIAGNOSIS — I10 Essential (primary) hypertension: Secondary | ICD-10-CM

## 2023-02-16 MED ORDER — WEGOVY 0.25 MG/0.5ML ~~LOC~~ SOAJ
0.2500 mg | SUBCUTANEOUS | 0 refills | Status: DC
Start: 2023-02-16 — End: 2023-03-20

## 2023-02-16 NOTE — Telephone Encounter (Signed)
Needs a follow-up before the next refill as well as possible US thyroid.

## 2023-03-15 ENCOUNTER — Other Ambulatory Visit: Payer: Self-pay | Admitting: Physician Assistant

## 2023-03-15 DIAGNOSIS — E782 Mixed hyperlipidemia: Secondary | ICD-10-CM

## 2023-03-15 DIAGNOSIS — I10 Essential (primary) hypertension: Secondary | ICD-10-CM

## 2023-03-16 ENCOUNTER — Other Ambulatory Visit: Payer: Self-pay | Admitting: Physician Assistant

## 2023-03-16 DIAGNOSIS — I10 Essential (primary) hypertension: Secondary | ICD-10-CM

## 2023-03-16 DIAGNOSIS — E782 Mixed hyperlipidemia: Secondary | ICD-10-CM

## 2023-03-16 NOTE — Telephone Encounter (Signed)
Requested medication (s) are due for refill today: Yes  Requested medication (s) are on the active medication list: Yes  Last refill:  02/16/23  Future visit scheduled: No  Notes to clinic:  Left message to call and make appointment.    Requested Prescriptions  Pending Prescriptions Disp Refills   WEGOVY 0.25 MG/0.5ML SOAJ [Pharmacy Med Name: Wegovy 0.25 MG/0.5ML Subcutaneous Solution Auto-injector] 4 mL 0    Sig: INJECT ONE SYRINGEFUL INTO THE SKIN ONCE WEEKLY  (NEEDS APPOINTMENT AND US THYROID BEFORE THE NEXT REFILL AND POSSIBLE INCREASE IN DOSE)     Endocrinology:  Diabetes - GLP-1 Receptor Agonists - semaglutide Failed - 03/16/2023  2:06 PM      Failed - HBA1C in normal range and within 180 days    Hgb A1c MFr Bld  Date Value Ref Range Status  12/19/2022 6.3 (H) 4.8 - 5.6 % Final    Comment:             Prediabetes: 5.7 - 6.4          Diabetes: >6.4          Glycemic control for adults with diabetes: <7.0          Passed - Cr in normal range and within 360 days    Creatinine, Ser  Date Value Ref Range Status  12/19/2022 0.80 0.57 - 1.00 mg/dL Final         Passed - Valid encounter within last 6 months    Recent Outpatient Visits           3 months ago Annual physical exam   [place] [facility] [place], [place], PA-C   1 year ago Impacted cerumen, unspecified laterality   [place] [facility] [place], [place], PA-C   1 year ago Encounter for annual health examination   [facility] Alfredia Ferguson, [place]   2 years ago Annual physical exam   [facility] [place], Alessandra [place], [place]   3 years ago Skin tag   [facility] [place], [place], [place]

## 2023-03-20 ENCOUNTER — Other Ambulatory Visit: Payer: Self-pay | Admitting: Physician Assistant

## 2023-03-20 DIAGNOSIS — I10 Essential (primary) hypertension: Secondary | ICD-10-CM

## 2023-03-20 DIAGNOSIS — E782 Mixed hyperlipidemia: Secondary | ICD-10-CM

## 2023-03-21 ENCOUNTER — Telehealth: Payer: Self-pay

## 2023-03-21 NOTE — Telephone Encounter (Signed)
Copied from CRM [id number]. Topic: General - Other >> Mar 21, 2023  2:30 PM Jamie Fuentes wrote: Reason for CRM: The patient has been directed by their pharmacy to contact their PCP and follow up on the status of previously requested refills of Semaglutide-Weight Management (WEGOVY) 0.25 MG/0.5ML Ivory Broad [[id number]]  Please contact the patient further when possible

## 2023-03-21 NOTE — Telephone Encounter (Signed)
The following note was listed on the [facility] prescription sent on 02/16/23:  Needs an appointment and US thyroid before the next refill and possible increase in a dose.

## 2023-03-22 ENCOUNTER — Telehealth: Payer: Self-pay | Admitting: Physician Assistant

## 2023-03-22 MED ORDER — WEGOVY 0.25 MG/0.5ML ~~LOC~~ SOAJ
0.2500 mg | SUBCUTANEOUS | 0 refills | Status: DC
Start: 2023-03-22 — End: 2023-12-17

## 2023-03-22 NOTE — Telephone Encounter (Signed)
I called and spoke to patient who reports she did not know she needed follow up. We scheduled for Janna's first available on 04/05/23 at 8:00 am.   It appears that around lunch time today Janna sent in her [facility] prescription. I notified patient.

## 2023-03-22 NOTE — Telephone Encounter (Signed)
[facility] pharmacy is requesting refill Semaglutide-Weight Management (WEGOVY) 0.25 MG/0.5ML SOAJ  Please advise

## 2023-03-23 NOTE — Telephone Encounter (Signed)
Sent to pharmacy yesterday by [place].

## 2023-04-05 ENCOUNTER — Ambulatory Visit: Payer: No Typology Code available for payment source | Admitting: Physician Assistant

## 2023-10-24 ENCOUNTER — Other Ambulatory Visit: Payer: Self-pay | Admitting: Physician Assistant

## 2023-10-24 DIAGNOSIS — I10 Essential (primary) hypertension: Secondary | ICD-10-CM

## 2023-10-24 NOTE — Telephone Encounter (Unsigned)
 Copied from CRM #[id number]. Topic: Clinical - Medication Refill >> Oct 24, 2023  8:32 AM Montie POUR wrote: Medication:  lisinopril -hydrochlorothiazide  (ZESTORETIC ) 20-12.5 MG tablet  amLODipine  (NORVASC ) 10 MG tablet  Has the patient contacted their pharmacy? Yes (Agent: If no, request that the patient contact the pharmacy for the refill. If patient does not wish to contact the pharmacy document the reason why and proceed with request.) (Agent: If yes, when and what did the pharmacy advise?) Pharmacy needs order to refill  This is the patient's preferred pharmacy:  [facility] [address], [place] - [address] [place] [address] Phone: [phone] Fax: [phone]  Is this the correct pharmacy for this prescription? Yes If no, delete pharmacy and type the correct one.   Has the prescription been filled recently? No  Is the patient out of the medication? No  Has the patient been seen for an appointment in the last year OR does the patient have an upcoming appointment? Yes  Can we respond through MyChart? Yes  Agent: Please be advised that Rx refills may take up to 3 business days. We ask that you follow-up with your pharmacy.

## 2023-12-11 ENCOUNTER — Encounter: Payer: Self-pay | Admitting: Physician Assistant

## 2023-12-17 ENCOUNTER — Ambulatory Visit (INDEPENDENT_AMBULATORY_CARE_PROVIDER_SITE_OTHER): Payer: Self-pay | Admitting: Physician Assistant

## 2023-12-17 ENCOUNTER — Encounter: Payer: Self-pay | Admitting: Physician Assistant

## 2023-12-17 ENCOUNTER — Telehealth: Payer: Self-pay | Admitting: Physician Assistant

## 2023-12-17 VITALS — BP 139/81 | HR 84 | Temp 98.1°F | Ht 64.5 in | Wt 273.4 lb

## 2023-12-17 DIAGNOSIS — Z0000 Encounter for general adult medical examination without abnormal findings: Secondary | ICD-10-CM

## 2023-12-17 DIAGNOSIS — Z23 Encounter for immunization: Secondary | ICD-10-CM | POA: Diagnosis not present

## 2023-12-17 DIAGNOSIS — Z1231 Encounter for screening mammogram for malignant neoplasm of breast: Secondary | ICD-10-CM | POA: Diagnosis not present

## 2023-12-17 DIAGNOSIS — L508 Other urticaria: Secondary | ICD-10-CM

## 2023-12-17 MED ORDER — AMLODIPINE BESYLATE 10 MG PO TABS: 10.0000 mg | ORAL_TABLET | Freq: Every day | ORAL | 0 refills | Status: DC

## 2023-12-17 MED ORDER — LISINOPRIL-HYDROCHLOROTHIAZIDE 20-12.5 MG PO TABS: 1.0000 | ORAL_TABLET | Freq: Every day | ORAL | 0 refills | Status: DC

## 2023-12-17 NOTE — Telephone Encounter (Signed)
 [facility] faxed refill request for the following medications:  Hydroxyzine  10mg  tablet    Please advise.

## 2023-12-17 NOTE — Progress Notes (Signed)
 Complete physical exam  [name]   DOB: 1970/08/12   53 y.o. Female  MRN: [id number] Visit Date: 12/17/2023  Today's healthcare provider: Jolynn Spencer, PA-C   Chief Complaint  Patient presents with   Annual Exam    Patient is present for annual exam with pcp.  Diet is normal, well balanced per patient. Exercising by going to the gym daily  Vaccines: Will receive flu, declines others Screenings: Order for mammogram pended, others UTD  Concerns: States that in the past she had anxiety attacks that cause her to break out in hives and took a medication to help with this- states this is happening again and would like to see if she could restart this    Subjective    Jamie Fuentes is a 53 y.o. female who presents today for a complete physical exam.    HPI     Annual Exam    Additional comments: Patient is present for annual exam with pcp.  Diet is normal, well balanced per patient. Exercising by going to the gym daily  Vaccines: Will receive flu, declines others Screenings: Order for mammogram pended, others UTD  Concerns: States that in the past she had anxiety attacks that cause her to break out in hives and took a medication to help with this- states this is happening again and would like to see if she could restart this       Last edited by Cherry Chiquita HERO, CMA on 12/17/2023  8:54 AM.     Discussed the use of AI scribe software for clinical note transcription with the patient, who gave verbal consent to proceed.  History of Present Illness Jamie Fuentes is a 53 year old female with hypertension who presents for medication refills and management of hives.  She experiences hives almost daily for a week at a time, which she associates with anxiety, particularly at work. Benadryl provides relief. She recalls a previous prescription, possibly hydroxyzine , that was effective and seeks a similar prescription.  She takes amlodipine  and lisinopril  for  hypertension. Her insurance covers these medications, but she is concerned about the cost of other prescriptions due to high out-of-pocket expenses.  She has been informed of elevated cholesterol and A1c levels, borderline for prediabetes, but is not on medication for these conditions. She prefers to wait for insurance changes in January before further blood work.  She performs monthly breast exams due to a family history of breast cancer in her sister-in-law. She is up to date with dental and eye exams. She denies chest pain, shortness of breath, rapid heart beating, bowel issues, urination problems, or vaginal discharge. She acknowledges significant anxiety at work but denies depression.      12/17/2023    8:58 AM 11/28/2022    1:54 PM  GAD 7 : Generalized Anxiety Score  Nervous, Anxious, on Edge 2 1  Control/stop worrying 0 0  Worry too much - different things 1 0  Trouble relaxing 1 0  Restless 1 0  Easily annoyed or irritable 1 0  Afraid - awful might happen 0 0  Total GAD 7 Score 6 1  Anxiety Difficulty Somewhat difficult Not difficult at all     Last depression screening scores    12/17/2023    8:57 AM 11/28/2022    1:54 PM 10/21/2021    9:23 AM  PHQ 2/9 Scores  PHQ - 2 Score 1 0 0  PHQ- 9 Score 3 2 3    Last fall  risk screening    12/17/2023    8:57 AM  Fall Risk   Falls in the past year? 0  Number falls in past yr: 0  Injury with Fall? 0  Risk for fall due to : No Fall Risks  Follow up Falls evaluation completed   Last Audit-C alcohol use screening    11/28/2022    1:54 PM  Alcohol Use Disorder Test (AUDIT)  1. How often do you have a drink containing alcohol? 1  2. How many drinks containing alcohol do you have on a typical day when you are drinking? 0  3. How often do you have six or more drinks on one occasion? 0  AUDIT-C Score 1   A score of 3 or more in women, and 4 or more in men indicates increased risk for alcohol abuse, EXCEPT if all of the points  are from question 1   Past Medical History:  Diagnosis Date   Colitis, ulcerative chronic (HCC)    Hypertension    Past Surgical History:  Procedure Laterality Date   BIOPSY  08/16/2022   Procedure: BIOPSY;  Surgeon: Unk Corinn Skiff, MD;  Location: [facility] ENDOSCOPY;  Service: Gastroenterology;;   CESAREAN SECTION     COLONOSCOPY WITH PROPOFOL  N/A 08/16/2022   Procedure: COLONOSCOPY WITH PROPOFOL ;  Surgeon: Unk Corinn Skiff, MD;  Location: [facility] ENDOSCOPY;  Service: Gastroenterology;  Laterality: N/A;   POLYPECTOMY  08/16/2022   Procedure: POLYPECTOMY;  Surgeon: Unk Corinn Skiff, MD;  Location: [facility] ENDOSCOPY;  Service: Gastroenterology;;   Social History   Socioeconomic History   Marital status: Married    Spouse name: Not on file   Number of children: Not on file   Years of education: Not on file   Highest education level: Not on file  Occupational History   Not on file  Tobacco Use   Smoking status: Never   Smokeless tobacco: Never  Vaping Use   Vaping status: Never Used  Substance and Sexual Activity   Alcohol use: Yes    Comment: rare   Drug use: No   Sexual activity: Yes  Other Topics Concern   Not on file  Social History Narrative   Not on file   Social Drivers of Health   Financial Resource Strain: Not on file  Food Insecurity: Not on file  Transportation Needs: Not on file  Physical Activity: Not on file  Stress: Not on file  Social Connections: Not on file  Intimate Partner Violence: Not on file   Family Status  Relation Name Status   Mother  Alive   Father  Alive   Sister  Alive   Brother  Alive   Brother  Alive   Neg Hx  (Not Specified)  No partnership data on file   Family History  Problem Relation Age of Onset   Diabetes Father    Hypertension Father    Thyroid cancer Sister    Hypertension Brother    Breast cancer Neg Hx    Allergies  Allergen Reactions   Milk (Cow)    Sulfa Antibiotics Nausea Only, Rash and Nausea And  Vomiting    Patient Care Team: Raymie Trani, PA-C as PCP - General (Physician Assistant)   Medications: Outpatient Medications Prior to Visit  Medication Sig Note   [DISCONTINUED] amLODipine  (NORVASC ) 10 MG tablet Take 1 tablet by mouth once daily    [DISCONTINUED] lisinopril -hydrochlorothiazide  (ZESTORETIC ) 20-12.5 MG tablet Take 1 tablet by mouth once daily    [DISCONTINUED] mesalamine  (  CANASA ) 1000 MG suppository Place 1 suppository (1,000 mg total) rectally at bedtime.    [DISCONTINUED] Semaglutide -Weight Management (WEGOVY ) 0.25 MG/0.5ML SOAJ Inject 0.25 mg into the skin once a week. INJECT ONE SYRINGEFUL INTO THE SKIN ONCE WEEKLY 12/17/2023: Patient states insurance would not cover   [DISCONTINUED] VITAMIN D  PO Take by mouth.    No facility-administered medications prior to visit.    Review of Systems  All other systems reviewed and are negative.  Except see HPI     Objective    BP 139/81 (BP Location: Right Arm, Patient Position: Sitting, Cuff Size: Normal)   Pulse 84   Temp 98.1 F (36.7 C) (Oral)   Ht 5' 4.5 (1.638 m)   Wt 273 lb 6.4 oz (124 kg)   LMP 12/10/2023 (Exact Date)   SpO2 98%   BMI 46.20 kg/m      Physical Exam Vitals reviewed.  Constitutional:      General: She is not in acute distress.    Appearance: Normal appearance. She is well-developed. She is not ill-appearing, toxic-appearing or diaphoretic.  HENT:     Head: Normocephalic and atraumatic.     Right Ear: Tympanic membrane, ear canal and external ear normal.     Left Ear: Tympanic membrane, ear canal and external ear normal.     Nose: Nose normal. No congestion or rhinorrhea.     Mouth/Throat:     Mouth: Mucous membranes are moist.     Pharynx: Oropharynx is clear. No oropharyngeal exudate.  Eyes:     General: No scleral icterus.       Right eye: No discharge.        Left eye: No discharge.     Conjunctiva/sclera: Conjunctivae normal.     Pupils: Pupils are equal, round, and  reactive to light.  Neck:     Thyroid: No thyromegaly.     Vascular: No carotid bruit.  Cardiovascular:     Rate and Rhythm: Normal rate and regular rhythm.     Pulses: Normal pulses.     Heart sounds: Normal heart sounds. No murmur heard.    No friction rub. No gallop.  Pulmonary:     Effort: Pulmonary effort is normal. No respiratory distress.     Breath sounds: Normal breath sounds. No wheezing or rales.  Abdominal:     General: Abdomen is flat. Bowel sounds are normal. There is no distension.     Palpations: Abdomen is soft. There is no mass.     Tenderness: There is no abdominal tenderness. There is no right CVA tenderness, left CVA tenderness, guarding or rebound.     Hernia: No hernia is present.  Musculoskeletal:        General: No swelling, tenderness, deformity or signs of injury. Normal range of motion.     Cervical back: Normal range of motion and neck supple. No rigidity or tenderness.     Right lower leg: No edema.     Left lower leg: No edema.  Lymphadenopathy:     Cervical: No cervical adenopathy.  Skin:    General: Skin is warm and dry.     Coloration: Skin is not jaundiced or pale.     Findings: No bruising, erythema, lesion or rash.  Neurological:     Mental Status: She is alert and oriented to person, place, and time. Mental status is at baseline.     Gait: Gait normal.  Psychiatric:        Mood and Affect: Mood  normal.        Behavior: Behavior normal.        Thought Content: Thought content normal.        Judgment: Judgment normal.      No results found for any visits on 12/17/23.  Assessment & Plan    Routine Health Maintenance and Physical Exam  Exercise Activities and Dietary recommendations  Goals   None     Immunization History  Administered Date(s) Administered   Influenza Split 02/10/2010, 02/13/2011, 04/05/2012   Influenza, Seasonal, Injecte, Preservative Fre 11/28/2022, 12/17/2023   Influenza,inj,Quad PF,6+ Mos 11/11/2015,  12/21/2016, 01/02/2018, 01/09/2020   Td 01/02/2018   Tdap 04/30/2006    Health Maintenance  Topic Date Due   Breast Cancer Screening  01/02/2024   COVID-19 Vaccine (1 - 2025-26 season) 01/02/2024*   Zoster (Shingles) Vaccine (1 of 2) 03/18/2024*   Pneumococcal Vaccine for age over 43 (1 of 1 - PCV) 12/16/2024*   Hepatitis B Vaccine (1 of 3 - 19+ 3-dose series) 12/16/2024*   Pap with HPV screening  08/12/2026   DTaP/Tdap/Td vaccine (3 - Td or Tdap) 01/03/2028   Colon Cancer Screening  08/15/2032   Flu Shot  Completed   Hepatitis C Screening  Completed   HPV Vaccine  Aged Out   Meningitis B Vaccine  Aged Out   HIV Screening  Discontinued  *Topic was postponed. The date shown is not the original due date.    Discussed health benefits of physical activity, and encouraged her to engage in regular exercise appropriate for her age and condition.   Assessment & Plan Essential hypertension Blood pressure well-controlled with current medications. - Continue amlodipine  and lisinopril . - Schedule follow-up in three months for blood pressure monitoring.  Chronic urticaria (hives) Recurrent hives linked to anxiety. Benadryl insufficient. - Prescribe hydroxyzine . - Refer to allergist for further evaluation.  Anxiety Significant anxiety at work contributing to hives. No depression signs.  Prediabetes Previous A1c borderline elevated. - Schedule follow-up in January for A1c monitoring.  Hypercholesterolemia Previous cholesterol levels borderline elevated. - Schedule follow-up in January for cholesterol monitoring.  Vitamin D  deficiency (suspected) Suspected deficiency based on previous assessments. - Include vitamin D  level assessment in January blood work.  General Health Maintenance Up to date with dental and eye exams. Regular breast self-exams. Mammogram ordered. Flu vaccine planned. - Administer flu vaccine. - Ensure mammogram completion. - Schedule annual physical in one  year.  Follow-Up Discussed follow-up visits and blood work with potential insurance changes. - Schedule follow-up in three months for blood pressure and chronic condition management. - Schedule follow-up in January for comprehensive blood work.   Return in about 3 months (around 03/18/2024) for BP f/u in 3 mo and cpe in a year.    The patient was advised to call back or seek an in-person evaluation if the symptoms worsen or if the condition fails to improve as anticipated.  I discussed the assessment and treatment plan with the patient. The patient was provided an opportunity to ask questions and all were answered. The patient agreed with the plan and demonstrated an understanding of the instructions.  I, Asha Grumbine, PA-C have reviewed all documentation for this visit. The documentation on 12/17/2023  for the exam, diagnosis, procedures, and orders are all accurate and complete.  Jolynn Spencer, [facility], MMS [facility] [phone] (phone) [phone] (fax)  [facility]

## 2023-12-17 NOTE — Telephone Encounter (Signed)
 Not currently on med list, LOV today and f/u not scheduled

## 2023-12-19 MED ORDER — HYDROXYZINE HCL 10 MG PO TABS: 10.0000 mg | ORAL_TABLET | Freq: Three times a day (TID) | ORAL | 1 refills | Status: AC | PRN

## 2024-04-21 ENCOUNTER — Encounter
